# Patient Record
Sex: Male | Born: 1985 | Race: Black or African American | Hispanic: No | Marital: Single | Smoking: Current every day smoker
Health system: Southern US, Community
[De-identification: ages and names within clinical notes are randomized; demographics above are authoritative.]

## PROBLEM LIST (undated history)

## (undated) DIAGNOSIS — I10 Essential (primary) hypertension: Secondary | ICD-10-CM

---
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
## 2007-06-02 ENCOUNTER — Ambulatory Visit: Payer: Self-pay | Admitting: Internal Medicine

## 2007-06-02 ENCOUNTER — Inpatient Hospital Stay (HOSPITAL_COMMUNITY): Admission: EM | Admit: 2007-06-02 | Discharge: 2007-06-03 | Payer: Self-pay | Admitting: Emergency Medicine

## 2008-04-04 ENCOUNTER — Inpatient Hospital Stay (HOSPITAL_COMMUNITY): Admission: EM | Admit: 2008-04-04 | Discharge: 2008-04-07 | Payer: Self-pay | Admitting: Emergency Medicine

## 2008-04-24 ENCOUNTER — Emergency Department (HOSPITAL_COMMUNITY): Admission: EM | Admit: 2008-04-24 | Discharge: 2008-04-24 | Payer: Self-pay | Admitting: Emergency Medicine

## 2008-05-22 ENCOUNTER — Encounter: Admission: RE | Admit: 2008-05-22 | Discharge: 2008-05-22 | Payer: Self-pay | Admitting: Otolaryngology

## 2008-11-11 ENCOUNTER — Emergency Department (HOSPITAL_COMMUNITY): Admission: EM | Admit: 2008-11-11 | Discharge: 2008-11-11 | Payer: Self-pay | Admitting: Emergency Medicine

## 2009-05-13 ENCOUNTER — Emergency Department (HOSPITAL_COMMUNITY): Admission: EM | Admit: 2009-05-13 | Discharge: 2009-05-14 | Payer: Self-pay | Admitting: Emergency Medicine

## 2010-08-18 ENCOUNTER — Encounter: Payer: Self-pay | Admitting: Otolaryngology

## 2010-10-31 LAB — URINALYSIS, ROUTINE W REFLEX MICROSCOPIC
Bilirubin Urine: NEGATIVE
Hgb urine dipstick: NEGATIVE
Ketones, ur: NEGATIVE mg/dL
Nitrite: NEGATIVE
Specific Gravity, Urine: 1.01 (ref 1.005–1.030)
Urobilinogen, UA: 0.2 mg/dL (ref 0.0–1.0)
pH: 6 (ref 5.0–8.0)

## 2010-10-31 LAB — BASIC METABOLIC PANEL
GFR calc Af Amer: >60 mL/min (ref >60)
Glucose, Bld: 370 mg/dL — ABNORMAL HIGH (ref 70–99)
Potassium: 3.3 mEq/L — ABNORMAL LOW (ref 3.5–5.1)
Sodium: 134 mEq/L — ABNORMAL LOW (ref 135–145)

## 2010-10-31 LAB — DIFFERENTIAL
Basophils Relative: 1 % (ref 0–1)
Eosinophils Absolute: 0.2 10*3/uL (ref 0.0–0.7)
Eosinophils Relative: 2 % (ref 0–5)
Lymphs Abs: 2.9 10*3/uL (ref 0.7–4.0)
Monocytes Absolute: 0.6 10*3/uL (ref 0.1–1.0)
Neutro Abs: 6.8 10*3/uL (ref 1.7–7.7)
Neutrophils Relative %: 64 % (ref 43–77)

## 2010-10-31 LAB — GLUCOSE, CAPILLARY
Glucose-Capillary: 253 mg/dL — ABNORMAL HIGH (ref 70–99)
Glucose-Capillary: 364 mg/dL — ABNORMAL HIGH (ref 70–99)

## 2010-10-31 LAB — CBC: Platelets: 467 10*3/uL — ABNORMAL HIGH (ref 150–400)

## 2010-10-31 LAB — URINE MICROSCOPIC-ADD ON: Urine-Other: NONE SEEN

## 2010-12-10 NOTE — H&P (Signed)
NAME:  Curtis Rich, Curtis Rich                   ACCOUNT NO.:  [id number]   MEDICAL RECORD NO.:  [id number]          Curtis Rich TYPE:  INP   LOCATION:  A330                          FACILITY:  [facility]   PHYSICIAN:  Osvaldo Shipper, MD     DATE OF BIRTH:  [birth year]   DATE OF ADMISSION:  06/02/2007  DATE OF DISCHARGE:  LH                              HISTORY & PHYSICAL   PRIMARY CARE PHYSICIAN:  Dr. Daphine Deutscher, [facility],  [place], [place]. C.   ADMITTING DIAGNOSIS:  1. Acute pancreatitis of unclear etiology.  2. History of hypertension.  3. Obesity.   CHIEF COMPLAINT:  Abdominal pain for two months.   HISTORY OF PRESENT ILLNESS:  Curtis Rich is a 25 year old African-American  male, who goes to school in [place] and who has history of  hypertension, who is obese and does play football in his school, who  started having abdominal pain in his upper abdomen about two months ago.  He kind of ignored it and then, last night, it got worse and he decided  to come into the ED.  The pain is described as a cramping type of pain,  on and off, located in the upper abdomen, radiating to the left and the  back occasionally.  Pain was 10/10 in intensity when he came in.  No  precipitating factor identified.  No aggravating or relieving factors  identified.   There was no nausea, vomiting or diarrhea.  The Curtis Rich's pain now is  improved from when he was in the ED.  He has never had similar complaints before.  He does not consume alcohol  on a significant basis.  Denies any illicit drug use.   MEDICATIONS AT HOME:  He is on Benicar 20 mg once a day and has been on  it for three years now.   ALLERGIES:  He denied any drug allergies.   PAST MEDICAL HISTORY:  Hypertension, diagnosed three years ago.  He is  otherwise obese.  Denies any other medical problems.  Denies any  surgical history, as well.   SOCIAL HISTORY:  He lives actually in [place], goes to school there,  but he is currently visiting his  parents here, in [place].  He denies  smoking.  Occasional alcohol use.  Denies illicit drug use.  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   FAMILY HISTORY:  Positive hypertension, diabetes, otherwise  unremarkable.   REVIEW OF SYSTEMS:  GENERALLY:  Positive for weakness.  HEENT:  Unremarkable.  CARDIOVASCULAR:  Unremarkable.  RESPIRATORY:  Unremarkable.  GI:  As in HPI.  GU:  Unremarkable.  MUSCULOSKELETAL:  Unremarkable.  DERMATOLOGICAL:  Unremarkable.  PSYCHIATRIC:  Unremarkable.  NEUROLOGIC:  Unremarkable.  OTHER SYSTEMS:  Unremarkable.   PHYSICAL EXAMINATION:  VITAL SIGNS:  Temperature 98, blood pressure  133/77, heart rate 67, respiratory rate 18, saturation not tested.  GENERAL EXAM:  This is an obese, young, black male, in no distress.  HEENT:  There is no pallor, no icterus.  Oral mucous membranes moist, no  oral lesions are noted.  NECK:  Soft and supple.  No thyromegaly is appreciated.  LUNGS:  Clear to auscultation bilaterally.  CARDIOVASCULAR:  S1, S2 normal, regular.  No murmurs appreciated.  No S3  or S4.  ABDOMEN:  Obese.  There is some mild tenderness in the epigastric area.  No rebound, rigidity or guarding.  No mass or organomegaly is  appreciated.  EXTREMITIES:  Without edema.  Peripheral pulses are palpable.  NEUROLOGICALLY:  Curtis Rich is alert and oriented times three.  No focal  neurological deficits are present.   LABORATORY DATA:  White count is normal, hemoglobin 12.8, MCV is 76,  platelet count is 522.  Glucose 115, AST is 46, amylase 247, lipase 315.  UA showed small leukocytes, 21-50 WBC, few bacteria.   He had a CT of his abdomen which showed evidence for mild acute  pancreatitis and mild upper abdominal adenopathy, thought to be  reactive.   IMPRESSION:  This is a 25 year old African-American male with  hypertension, who presents with two-month history of abdominal pain.  He  has evidence for acute pancreatitis.  Etiology is unclear.  No   gallstones detected on CT.  We will get an ultrasound.  He denies  significant alcohol use.  He could have dyslipidemia and  hypertriglyceridemia, which could be causing this.  He could have occult  gallstones, as well.   PLAN:  1. Acute pancreatitis.  Check triglyceride levels.  Check ultrasound.      GI is following him.  They will advance his diet when they think it      is feasible.  We will await improvement symptomatically and workup      to be completed before discharging this Curtis Rich.  2. Possible urinary tract infection, will be given Cipro at least for      three days.  3. Hypertension.  Continue Benicar for now.   Further management decisions will be based on results of initial testing  and Curtis Rich's response to treatment.      Osvaldo Shipper, MD  Electronically Signed     GK/MEDQ  D:  06/02/2007  T:  06/02/2007  Job:  [id number]   cc:   Ginette Otto, [place] [facility]

## 2010-12-10 NOTE — Discharge Summary (Signed)
Curtis Rich, Curtis Rich                   ACCOUNT NO.:  [id number]   MEDICAL RECORD NO.:  [id number]          PATIENT TYPE:  INP   LOCATION:  A325                          FACILITY:  [facility]   PHYSICIAN:  Osvaldo Shipper, MD     DATE OF BIRTH:  1986/01/15   DATE OF ADMISSION:  04/04/2008  DATE OF DISCHARGE:  09/11/2009LH                               DISCHARGE SUMMARY   PRIMARY CARE PHYSICIAN:  Dr. Daphine Deutscher in [place].   DISCHARGE DIAGNOSES:  1. Tonsillitis/pharyngitis, improved.  2. Allergic rhinitis.  3. Type 2 diabetes.  4. Hypertension.  5. Obesity.   Please review H&P dictated by Dr. Lilian Kapur for details regarding the  patient's presenting illness.   BRIEF HOSPITAL COURSE:  Briefly, this is a 25 year old African American  male who presented to the ED complaining of sore throat.  He had been to  the [facility] about 10 days prior to his presentation with  similar complaints and was prescribed amoxicillin.  He took about 8 days  of amoxicillin and showed no improvement, and so he presented to the ED.  He was found to be afebrile in the emergency department.  He did,  however, have an elevated white count of 34,000.  He was also noted to  be slightly dehydrated.  His strep screen was negative.  Influenza was  negative.  Blood cultures negative.  He underwent CT scan of his neck  with contrast which showed generalized tonsillar enlargement in the  nasopharynx and the oropharynx.  Mild bilateral jugulodigastric  lymphadenopathy was also noted.  No evidence for abscess was seen.   With the Unasyn, the patient has improved.  His white count has improved  to 23,000.  He remains afebrile.  He is tolerating p.o. intake.  No  difficulty with breathing has been noticed.  No stridor was seen on  examination.  The rest of the treatment can be continued as an  outpatient.  Instead of amoxicillin, I am prescribing Keflex, which  might work better for him.   I have also told the patient  to talk to his doctor about referring him  to an ENT specialist.  This is the patient's third episode of  tonsillitis within the last year and a half.  The patient may be a  candidate for an elective tonsillectomy in the near future.   He also has a history of type 2 diabetes for which he was started back  on his oral hypoglycemic agents.  HbA1c was not checked during this  admission.   He was also noted to be hypertensive.  Upon further questioning, the  patient mentioned that he used to take Benicar, but he stopped taking it  per his PMDs recommendations.  His blood pressure has been consistently  running high even without significant pain, and so we restarted the  Benicar.  His blood pressures have improved.  He will need to follow up  with his PMD.  If he has not been worked up for secondary causes of  hypertension, we would recommend that this also be pursued as  an  outpatient.   He also has obesity.  Weight loss counseling was provided.   I have told him to stay off work for at least 1 week, and then talk to  his PMD about returning to work.   On the day of discharge, the patient is feeling better.  He is having  some nasal congestion, which he was having yesterday as well, for which  I have started Flonase and that seems to be helping slightly.  Otherwise, his throat feels better.  He is tolerating p.o. intake quite  well.  Denies any shortness of breath.  No drooling of saliva is noted.  His vital signs, except for the blood pressure, everything is quite  stable.  His throat did show erythema in the pharyngeal area with  enlarged tonsils.  No stridor was noted.  He does have cervical  lymphadenopathy bilaterally.  Examination of the nose continued to  reveal nasal congestion.   Lungs were clear.  Cardiac exam was benign.   LABORATORY DATA:  White count is 23,700 today with 80% neutrophils, no  bands reported.  Hemoglobin is 12.6, platelet count is 510.  His sodium  is  134.  His other electrolytes are normal.   So the patient is stable for discharge.  I have told him to return to  the hospital if his symptoms get worse, if he develops high fevers, has  difficulty breathing or difficulty swallowing food and water.   DISCHARGE MEDICATIONS:  1. Keflex 500 mg t.i.d. for 10 days.  2. Benicar 20 mg once daily.  3. ActoPlus Met 15/850 once daily.  4. Flonase 1 spray each nostril once a day.   DIET:  Soft diet with chopped meats for the next 1 week, and then as he  feels better he can increase and go back to his baseline diet.   PHYSICAL ACTIVITY:  No exertion for the next 1 week.  To stay out of  work for the next 1 week.  A note will be provided to the patient.   Follow up with his PMD early next week neck.   ENT referral to be provided by his PMD.   Total time on this discharge encounter 35 minutes.      Osvaldo Shipper, MD  Electronically Signed    GK/MEDQ  D:  04/07/2008  T:  04/07/2008  Job:  [id number]   cc:   Daphine Deutscher, MD  [employer]  [place], [place]

## 2010-12-10 NOTE — Consult Note (Signed)
NAME:  Curtis Rich, BORN                   ACCOUNT NO.:  [id number]   MEDICAL RECORD NO.:  [id number]          PATIENT TYPE:  INP   LOCATION:  A330                          FACILITY:  [facility]   PHYSICIAN:  R. Roetta Sessions, M.D. DATE OF BIRTH:  [birth year]   DATE OF CONSULTATION:  06/02/2007  DATE OF DISCHARGE:                                 CONSULTATION   REASON FOR CONSULTATION:  Acute pancreatitis.   REQUESTING PHYSICIAN:  [facility].   HISTORY OF PRESENT ILLNESS:  The patient is a 25 year old African-  American gentleman who presented to the emergency department with  progressive abdominal pain. He complains of intermittent abdominal pain  for about four weeks. Pain is located in the epigastric and left upper  quadrant region. Pain is not necessarily worse with meals. He has noted  a diminished appetite. He states he does not eat like he used to. He  plays football and is a fairly large guy. He has been able to proceed  with football practice up until this week. He also has had some  constipation. He denies any melena or rectal bleeding, heartburn,  dysphagia, odynophagia, nausea or vomiting. He states he does consume  some alcohol but is very limited in amounts. He never drinks more than 1  beer at a time. He has not had any alcohol in over a week. He states he  had his lipid panel done about a year ago and was unremarkable. He does  have hypertension.   MEDICATIONS AT HOME:  Benicar 20 mg daily. Denies any NSAID or aspirin  use.   ALLERGIES:  No known drug allergies.   PAST MEDICAL HISTORY:  Hypertension.   PAST SURGICAL HISTORY:  None.   FAMILY HISTORY:  Negative for chronic GI illness, colorectal cancer or  pancreatitis.   SOCIAL HISTORY:  He is single. 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 Occasional alcohol use as outlined above.  Denies tobacco or drug use.   REVIEW OF SYSTEMS:  See HPI for GI, constitutional. CARDIOPULMONARY:  Denies  chest pain or shortness of breath.   PHYSICAL EXAMINATION:  Temperature 98, pulse 67, respirations 20, blood  pressure 133/77.  GENERAL:  Pleasant, obese, black male in no acute distress. He is  accompanied by his parents.  SKIN:  Warm and dry. No jaundice.  HEENT:  Sclerae nonicteric. Oropharyngeal mucosa moist and pink. No  lesions, erythema or exudate. No lymphadenopathy or thyromegaly.  CHEST:  Lungs are clear to auscultation.  CARDIAC EXAM:  Reveals regular rate and rhythm. No murmurs, rubs, or  gallops.  ABDOMEN:  Positive bowel sounds, obese but symmetrical, soft, nontender.  No organomegaly or masses. No rebound tenderness or guarding. No  abdominal hernias.  EXTREMITIES:  No edema.   LABORATORY DATA:  Sodium 138, potassium 4, BUN 13, creatinine 1.05,  glucose 115, total bilirubin 0.8, alkaline phosphatase 87, AST elevated  at 46, ALT 43, calcium 9.4.  Amylase 247, lipase 315. White count 8900,  hemoglobin 12.8, MCV 76.4, platelets 522,000. Urinalysis revealed trace  blood, small leukocytes. Microscopic revealed 21 to 50 WBCs, 3 to 6 RBCs  and few bacteria. CT of the abdomen and pelvis pending.   IMPRESSION:  The patient is a 25 year old gentleman with acute  pancreatitis. Etiology unclear at this time. He denies any significant  alcohol use. We need to exclude the possibility of biliary pancreatitis.  Triglyceride levels are pending although he reports they were normal  previously. He also has an abnormal urinalysis.   RECOMMENDATIONS:  1. N.p.o. and continue supportive measures.  2. PPI.  3. Follow up CT and labs as available.  4. Evaluation and workup of abnormal urinalysis per hospitalist.      Tana Coast, P.A.      Jonathon Bellows, M.D.  Electronically Signed    LL/MEDQ  D:  06/02/2007  T:  06/02/2007  Job:  [id number]   cc:   [facility]

## 2010-12-10 NOTE — Discharge Summary (Signed)
Curtis Rich, Curtis Rich                   ACCOUNT NO.:  [id number]   MEDICAL RECORD NO.:  [id number]          PATIENT TYPE:  INP   LOCATION:  A330                          FACILITY:  [facility]   PHYSICIAN:  Osvaldo Shipper, MD     DATE OF BIRTH:  Sep 30, 1985   DATE OF ADMISSION:  06/02/2007  DATE OF DISCHARGE:  11/06/2008LH                               DISCHARGE SUMMARY   DISCHARGE DIAGNOSES:  1. Acute pancreatitis, unclear etiology, improved.  2. Obesity.  3. Hypertension.   Please see H&P for details regarding present illness.   BRIEF HOSPITAL COURSE:  This a 25 year old African American male who is  obese who has a history of hypertension who presented complaining of two-  month history of abdominal pain.  The patient was found to have acute  pancreatitis.  He underwent a CT scan of the abdomen which showed  findings of pancreatitis.  He underwent ultrasound as well which did not  show any biliary stones or ductal dilatation.  The patient was seen by  gastroenterology who advanced his diet yesterday and he has done well.  The pain has improved to about 3 out of 10 and he does not have any  nausea or vomiting and is tolerating p.o. intake quite well.  He is keen  on going home and I see no reason why he cannot be discharged at this  time.  GI does not want to additional work-up. His triglycerides were  checked and were normal.  Really no other etiology for his pain has been  found.  He has been told to absolutely abstain from any alcohol  consumption.  If he has recurrence of his symptoms, he may need  additional work-up in the form of an EUS.  The patient also had mildly  positive urinalysis for UTI.  Hence, we gave him a short course of  Cipro.   On the day of discharge, the patient is feeling better.  Pain is  improved. No nausea or vomiting.  Vital signs are all stable.  Examination is unremarkable and he is considered stable for discharge.   DISCHARGE MEDICATIONS:  1. Continue  Benicar 20 mg daily.  2. Cipro 250 mg b.i.d. for three more days.  3. Oxycodone 5 mg every six hours as needed for pain.   DIET:  He can have a low fat diet.   ACTIVITY:  He has been told to refrain from strenuous activity including  sports for about two weeks.  The patient does play football at his  school.  I have given him a note stating the above.   FOLLOW UP:  Follow up with GI in four to six weeks.  The patient does go  to school near [place], hence they can reschedule as they deem  necessary.  He will need followup with his PMD, Dr. Alwyn Pea with  [facility] in [place].   CONSULTATIONS:  Gastroenterology.   STUDIES:  CT scan of the abdomen.  Ultrasound of the abdomen.  Both  discussed above.   Total time of discharge:  40 minutes.  Osvaldo Shipper, MD  Electronically Signed     GK/MEDQ  D:  06/03/2007  T:  06/03/2007  Job:  [id number]   cc:   R. Roetta Sessions, M.D.  [address]  [place]  [place] [address]   Cephas Darby. Daphine Deutscher, M.D.  Fax: [phone]

## 2010-12-10 NOTE — H&P (Signed)
Curtis Rich, Curtis Rich                   ACCOUNT NO.:  [id number]   MEDICAL RECORD NO.:  [id number]          PATIENT TYPE:  INP   LOCATION:  A325                          FACILITY:  [facility]   PHYSICIAN:  Skeet Latch, DO    DATE OF BIRTH:  1986-03-27   DATE OF ADMISSION:  04/04/2008  DATE OF DISCHARGE:  LH                              HISTORY & PHYSICAL   PRIMARY CARE PHYSICIAN:  Tanya D. Daphine Deutscher, M.D. at [facility].   CHIEF COMPLAINT:  Sore throat.   HISTORY OF PRESENT ILLNESS:  This is a 25 year old African American male  who presented with a complaint of sore throat.  The patient states that  over the past three weeks, he has been having a continued sore throat.  He went to an urgent care and was treated for 10 days of antibiotics.  The patient states that the symptoms returned after the antibiotics were  discontinued, and the sore throat became more swollen, sore, painful.  Admitted to some fevers, difficulty swallowing, and slight chills.   PAST MEDICAL HISTORY:  1. Hypertension.  2. Diabetes.   PAST SURGICAL HISTORY:  Unremarkable.   FAMILY HISTORY:  Positive for diabetes.   SOCIAL HISTORY:  No history of alcohol or drug abuse.  Denies any  tobacco abuse.   DRUG ALLERGIES:  No known drug allergies.   HOME MEDICATIONS:  Patient says he takes medications for his diabetes,  unsure of the name and dosage.  Patient was on amoxicillin 1600 mg twice  daily.   REVIEW OF SYSTEMS:  CONSTITUTIONAL:  Positive for fever, chills, and  some anorexia.  HEENT:  Unremarkable except for sore throat and some nasal congestion.  CARDIOVASCULAR:  No chest pain or palpitations.  RESPIRATORY:  A slight cough.  No dyspnea or wheezing.  GASTROINTESTINAL:  No nausea, vomiting, diarrhea, or abdominal pain.  No  blood in the stool.  GENITOURINARY:  Unremarkable.  MUSCULOSKELETAL:  Positive for some neck pain.  Other systems are unremarkable.   PHYSICAL EXAMINATION:  Temperature  98.1, pulse 102, respirations 16,  blood pressure 145/76.  He is satting at 96% on room air.  CONSTITUTIONAL:  He is well-developed, well-nourished, and well-  hydrated.  No acute distress.  HEENT:  Head is normocephalic and atraumatic.  His eyes are PERRL, EOMI.  NECK:  Soft.  Slight cervical adenopathy is appreciated.  There is some  slight erythema to his pharynx.  Tonsils are swollen.  He had no  meningeal signs.  Slight submandibular swelling with __________ was  noted also.  CARDIOVASCULAR:  No murmurs, gallops or rubs.  He is slightly  tachycardic.  RESPIRATORY:  Lungs are clear to auscultation bilaterally.  No wheezes,  rales or rhonchi.  ABDOMEN:  Obese, soft, nontender, nondistended.  No rigidity or  guarding.  EXTREMITIES:  No clubbing, cyanosis or edema.  NEUROLOGIC:  Cranial nerves II-XII are grossly intact.  Patient is alert  and oriented x3.  SKIN:  Warm and dry.  No rashes are appreciated.   LABS:  So far, the blood cultures are unremarkable.  Rapid strep screen  is negative.  Monospot screen is negative.  Sodium 133, potassium 3.8,  chloride 100, CO2 26, glucose 170, BUN 9, creatinine 0.94.  His white  count is 34,800, hemoglobin 14, hematocrit 41.8, platelet count 475,000.   RADIOLOGIC STUDIES:  CT of his neck with contrast showed a generalized  tonsillar enlargement in the nasopharynx and oropharynx.  Mild bilateral  jugulodigastric lymphadenopathy with other shotty bilateral cervical  lymph nodes, lightly reactive in nature.  No evidence of abscess.   ASSESSMENT:  1. Leukocytosis.  2. Questionable tonsillitis with lymphadenopathy and cervical lymph      nodes.  3. History of diabetes.  4. Hypertension.   PLAN:  1. Patient admitted to a general medical bed.  2. Patient has been placed on IV Unasyn.  This will be continued at      this time.  Patient's white cell count will be checked in the a.m.      We will get a rapid flu test on the patient at this time  to rule      out any influenza also.  Patient will also be IV hydrated at this      time.  3. For his diabetes, patient will be placed on a sliding scale.  Blood      sugars checked q.a.c. and nightly.  Will get his home medications,      to start patient back on his home medications at this time.  4. Hypertension:  A little higher than normal.  Will continue to      monitor.  Patient states that he was on blood pressure medications      in the past and was discontinued.  5. Will follow patient's swallowing.  Will start the patient on a      liquid diet at this time and advance as tolerated.      Skeet Latch, DO  Electronically Signed     SM/MEDQ  D:  04/04/2008  T:  04/04/2008  Job:  [id number]

## 2011-03-25 ENCOUNTER — Emergency Department (HOSPITAL_COMMUNITY)
Admission: EM | Admit: 2011-03-25 | Discharge: 2011-03-25 | Disposition: A | Discharge status: Home / Self Care | Payer: BC Managed Care – PPO | Attending: Emergency Medicine | Admitting: Emergency Medicine

## 2011-03-25 DIAGNOSIS — Z794 Long term (current) use of insulin: Secondary | ICD-10-CM | POA: Insufficient documentation

## 2011-03-25 DIAGNOSIS — R739 Hyperglycemia, unspecified: Secondary | ICD-10-CM

## 2011-03-25 DIAGNOSIS — E119 Type 2 diabetes mellitus without complications: Secondary | ICD-10-CM | POA: Insufficient documentation

## 2011-03-25 DIAGNOSIS — F17200 Nicotine dependence, unspecified, uncomplicated: Secondary | ICD-10-CM | POA: Insufficient documentation

## 2011-03-25 LAB — DIFFERENTIAL
Eosinophils Absolute: 0.4 10*3/uL (ref 0.0–0.7)
Eosinophils Relative: 3 % (ref 0–5)
Lymphocytes Relative: 37 % (ref 12–46)
Lymphs Abs: 4.6 10*3/uL — ABNORMAL HIGH (ref 0.7–4.0)
Monocytes Absolute: 1 10*3/uL (ref 0.1–1.0)
Monocytes Relative: 8 % (ref 3–12)
Neutro Abs: 6.4 10*3/uL (ref 1.7–7.7)
Neutrophils Relative %: 52 % (ref 43–77)

## 2011-03-25 LAB — BASIC METABOLIC PANEL
BUN: 8 mg/dL (ref 6–23)
Calcium: 9.8 mg/dL (ref 8.4–10.5)
Creatinine, Ser: 0.91 mg/dL (ref 0.50–1.35)
Glucose, Bld: 460 mg/dL — ABNORMAL HIGH (ref 70–99)

## 2011-03-25 LAB — CBC
Hemoglobin: 14.4 g/dL (ref 13.0–17.0)
Platelets: 414 10*3/uL — ABNORMAL HIGH (ref 150–400)
RBC: 5.59 MIL/uL (ref 4.22–5.81)
WBC: 12.4 10*3/uL — ABNORMAL HIGH (ref 4.0–10.5)

## 2011-03-25 LAB — GLUCOSE, CAPILLARY: Glucose-Capillary: 545 mg/dL — ABNORMAL HIGH (ref 70–99)

## 2011-03-25 MED ORDER — SODIUM CHLORIDE 0.9 % IV BOLUS (SEPSIS)
1000.0000 mL | Freq: Once | INTRAVENOUS | Status: AC
  Administered 2011-03-25: 1000 mL via INTRAVENOUS

## 2011-03-25 MED ORDER — INSULIN ASPART 100 UNIT/ML ~~LOC~~ SOLN
10.0000 [IU] | Freq: Once | SUBCUTANEOUS | Status: AC
  Administered 2011-03-25: 10 [IU] via SUBCUTANEOUS

## 2011-03-25 MED ORDER — INSULIN REGULAR HUMAN 100 UNIT/ML IJ SOLN: 10.0000 [IU] | Freq: Once | INTRAMUSCULAR | Status: DC

## 2011-03-25 MED ORDER — INSULIN ASPART PROT & ASPART (70-30 MIX) 100 UNIT/ML ~~LOC~~ SUSP
20.0000 [IU] | SUBCUTANEOUS | Status: DC
  Administered 2011-03-25: 20 [IU] via SUBCUTANEOUS
  Filled 2011-03-25: qty 10

## 2011-03-25 MED ORDER — INSULIN ASPART PROT & ASPART (70-30 MIX) 100 UNIT/ML ~~LOC~~ SUSP: 40.0000 [IU] | Freq: Two times a day (BID) | SUBCUTANEOUS | Status: DC

## 2011-03-25 NOTE — ED Notes (Signed)
Left in c/o wife for transport home; a&ox4; in no distress

## 2011-03-25 NOTE — ED Notes (Signed)
Pt req not to be placed in hallway  bed

## 2011-03-25 NOTE — ED Notes (Signed)
Blood sugar elevated for a couple of weeks

## 2011-03-25 NOTE — ED Provider Notes (Signed)
History   Chart scribed for Donnetta Hutching, MD by [facility]; the patient was seen in room APA14/APA14; this patient's care was started at 2:31 PM.    CSN: [id number] Arrival date & time: 03/25/2011  2:07 PM  Chief Complaint  Patient presents with  . Hyperglycemia   HPI Curtis Rich is a 25 y.o. male who presents to the Emergency Department complaining of elevated blood sugar. Pt with DM, dx 3 years ago and blood sugar well-controlled (runs 110-120) with novalog 70/30 (40 units BID). Pt reports he ran out of his meds over the weekend and is unable to fill new rx until he is paid. Today when pt checked blood sugar, >600. He otherwise feels well. Denies n/v, abd pain, ha, dizziness, cp, or sob. No urinary complaints.   Past Medical History  Diagnosis Date  . Diabetes mellitus     History reviewed. No pertinent past surgical history.  History reviewed. No pertinent family history.  History  Substance Use Topics  . Smoking status: Current Everyday Smoker  . Smokeless tobacco: Not on file  . Alcohol Use: No   Previous Medications   No medications on file   *Novalog  Allergies as of 03/25/2011  . (No Known Allergies)      Review of Systems 10 Systems reviewed and are negative for acute change except as noted in the HPI.  Physical Exam  BP 155/98  Pulse 81  Temp(Src) 98.7 F (37.1 C) (Oral)  Ht 6\' 3"  (1.905 m)  Wt 325 lb (147.419 kg)  BMI 40.62 kg/m2  SpO2 98%  Physical Exam  Nursing note and vitals reviewed. Constitutional: He is oriented to person, place, and time. No distress.       Appearance consistent with age of record  HENT:  Head: Normocephalic and atraumatic.  Right Ear: External ear normal.  Left Ear: External ear normal.  Nose: Nose normal.  Mouth/Throat: Oropharynx is clear and moist.  Eyes: Conjunctivae are normal.  Neck: Neck supple.  Cardiovascular: Normal rate and regular rhythm.  Exam reveals no gallop and no friction rub.   No murmur  heard. Pulmonary/Chest: Effort normal and breath sounds normal. He has no wheezes. He has no rhonchi. He has no rales. He exhibits no tenderness.  Abdominal: Soft. There is no tenderness.  Musculoskeletal: Normal range of motion.       Normal appearance of extremities  Neurological: He is alert and oriented to person, place, and time. No sensory deficit.  Skin: No rash noted.       Color normal  Psychiatric: He has a normal mood and affect.    ED Course  Procedures OTHER DATA REVIEWED: Nursing notes and vital signs reviewed. Prior records reviewed.   LABS / RADIOLOGY: Results for orders placed during the hospital encounter of 03/25/11  GLUCOSE, CAPILLARY      Component Value Range   Glucose-Capillary 545 (*) 70 - 99 (mg/dL)   Comment 1 Notify RN    GLUCOSE, CAPILLARY      Component Value Range   Glucose-Capillary 496 (*) 70 - 99 (mg/dL)     ED COURSE: All results reviewed and discussed with pt, questions answered, pt agreeable with plan. Note patient in DKA. Will hydrate, labs, IV insulin.  MDM: Feeling much better after hydration and IV insulin. Not in DKA. Discussed with patient and girlfriend seriousness of not taking insulin. He understands  IMPRESSION: 1. Diabetes mellitus   2. Hyperglycemia       CONDITION ON DISCHARGE:  Improved  MEDS GIVEN IN ED:  Medications  sodium chloride 0.9 % bolus 1,000 mL (1000 mL Intravenous Given 03/25/11 1555)  insulin aspart protamine-insulin aspart (NOVOLOG 70/30) injection 20 Units (not administered)  insulin aspart protamine-insulin aspart (NOVOLOG MIX 70/30) (70-30) 100 UNIT/ML injection (not administered)  sodium chloride 0.9 % bolus 1,000 mL (1000 mL Intravenous Given 03/25/11 1437)  insulin aspart (novoLOG) injection 10 Units (10 Units Subcutaneous Given 03/25/11 1510)     DISCHARGE MEDICATIONS: New Prescriptions   INSULIN ASPART PROTAMINE-INSULIN ASPART (NOVOLOG MIX 70/30) (70-30) 100 UNIT/ML INJECTION    Inject 40  Units into the skin 2 (two) times daily with a meal.     SCRIBE ATTESTATION: I personally performed the services described in this documentation, which was scribed in my presence. The recorded information has been reviewed and considered. Donnetta Hutching, MD       Donnetta Hutching, MD 03/25/11 [phone]

## 2011-04-28 LAB — BASIC METABOLIC PANEL
BUN: 9
Chloride: 100
Creatinine, Ser: 0.85
GFR calc Af Amer: >60
GFR calc non Af Amer: >60
Potassium: 3.4 — ABNORMAL LOW

## 2011-04-28 LAB — DIFFERENTIAL
Basophils Absolute: 0
Eosinophils Relative: 1
Lymphocytes Relative: 9 — ABNORMAL LOW
Lymphs Abs: 1.5
Neutro Abs: 14.8 — ABNORMAL HIGH

## 2011-04-28 LAB — CBC
HCT: 40.1
Hemoglobin: 13.4
Platelets: 519 — ABNORMAL HIGH
WBC: 18.3 — ABNORMAL HIGH

## 2011-04-28 LAB — RAPID STREP SCREEN (MED CTR MEBANE ONLY): Streptococcus, Group A Screen (Direct): NEGATIVE

## 2011-04-30 LAB — GLUCOSE, CAPILLARY
Glucose-Capillary: 115 — ABNORMAL HIGH
Glucose-Capillary: 151 — ABNORMAL HIGH
Glucose-Capillary: 182 — ABNORMAL HIGH
Glucose-Capillary: 217 — ABNORMAL HIGH
Glucose-Capillary: 315 — ABNORMAL HIGH
Glucose-Capillary: 440 — ABNORMAL HIGH

## 2011-04-30 LAB — APTT: aPTT: 31

## 2011-04-30 LAB — DIFFERENTIAL
Basophils Absolute: 0
Basophils Absolute: 0
Basophils Relative: 0
Basophils Relative: 0
Basophils Relative: 0
Eosinophils Absolute: 0
Eosinophils Absolute: 0
Eosinophils Absolute: 0
Eosinophils Relative: 0
Eosinophils Relative: 0
Lymphocytes Relative: 4 — ABNORMAL LOW
Lymphocytes Relative: 5 — ABNORMAL LOW
Lymphs Abs: 1.4
Lymphs Abs: 1.8
Monocytes Absolute: 1.1 — ABNORMAL HIGH
Monocytes Absolute: 1.3 — ABNORMAL HIGH
Monocytes Absolute: 1.9 — ABNORMAL HIGH
Monocytes Absolute: 2.7 — ABNORMAL HIGH
Monocytes Relative: 12
Monocytes Relative: 4
Monocytes Relative: 8
Neutro Abs: 18.8 — ABNORMAL HIGH
Neutro Abs: 31.5 — ABNORMAL HIGH
Neutrophils Relative %: 82 — ABNORMAL HIGH
Neutrophils Relative %: 91 — ABNORMAL HIGH

## 2011-04-30 LAB — CULTURE, BLOOD (ROUTINE X 2): Culture: NO GROWTH

## 2011-04-30 LAB — RAPID STREP SCREEN (MED CTR MEBANE ONLY): Streptococcus, Group A Screen (Direct): NEGATIVE

## 2011-04-30 LAB — CBC
HCT: 40.3
Hemoglobin: 12.6 — ABNORMAL LOW
Hemoglobin: 13.3
Hemoglobin: 14
MCHC: 33.2
MCHC: 33.6
MCV: 75.9 — ABNORMAL LOW
MCV: 77.2 — ABNORMAL LOW
MCV: 78.4
RBC: 4.96
RBC: 5.49
RDW: 14.4
RDW: 14.4
WBC: 34.5 — ABNORMAL HIGH
WBC: 34.8 — ABNORMAL HIGH

## 2011-04-30 LAB — BASIC METABOLIC PANEL
BUN: 9
CO2: 25
CO2: 26
CO2: 27
CO2: 28
Calcium: 8.9
Calcium: 9.4
Calcium: 9.5
Calcium: 9.6
Chloride: 103
Chloride: 99
Creatinine, Ser: 0.82
Creatinine, Ser: 0.94
GFR calc Af Amer: >60
GFR calc Af Amer: >60
GFR calc non Af Amer: >60
Glucose, Bld: 117 — ABNORMAL HIGH
Glucose, Bld: 128 — ABNORMAL HIGH
Glucose, Bld: 202 — ABNORMAL HIGH
Potassium: 4.6
Sodium: 133 — ABNORMAL LOW
Sodium: 134 — ABNORMAL LOW
Sodium: 137

## 2011-04-30 LAB — INFLUENZA A+B VIRUS AG-DIRECT(RAPID)
Inflenza A Ag: NEGATIVE
Influenza B Ag: NEGATIVE

## 2011-04-30 LAB — STREP A DNA PROBE

## 2011-05-06 LAB — DIFFERENTIAL
Eosinophils Absolute: 0.3
Eosinophils Relative: 4
Lymphocytes Relative: 25
Lymphocytes Relative: 28
Lymphs Abs: 1.7
Lymphs Abs: 2.5
Monocytes Absolute: 1 — ABNORMAL HIGH
Neutrophils Relative %: 59

## 2011-05-06 LAB — CBC
MCHC: 33
MCV: 75.3 — ABNORMAL LOW
MCV: 76.4 — ABNORMAL LOW
Platelets: 478 — ABNORMAL HIGH
Platelets: 522 — ABNORMAL HIGH
WBC: 6.8
WBC: 8.9

## 2011-05-06 LAB — HEPATIC FUNCTION PANEL
ALT: 38
AST: 37
Albumin: 3.5
Alkaline Phosphatase: 82
Total Bilirubin: 1
Total Protein: 7.1

## 2011-05-06 LAB — COMPREHENSIVE METABOLIC PANEL
AST: 46 — ABNORMAL HIGH
Albumin: 3.9
Calcium: 9.4
Chloride: 105
Creatinine, Ser: 1.05
GFR calc Af Amer: >60
Sodium: 138

## 2011-05-06 LAB — URINE CULTURE
Colony Count: NO GROWTH
Culture: NO GROWTH

## 2011-05-06 LAB — LIPID PANEL
HDL: 25 — ABNORMAL LOW
Total CHOL/HDL Ratio: 6.5
Triglycerides: 80
VLDL: 16

## 2011-05-06 LAB — URINALYSIS, ROUTINE W REFLEX MICROSCOPIC
Bilirubin Urine: NEGATIVE
Glucose, UA: NEGATIVE
Specific Gravity, Urine: 1.025
Urobilinogen, UA: 0.2
pH: 6

## 2011-05-06 LAB — RAPID URINE DRUG SCREEN, HOSP PERFORMED
Barbiturates: NOT DETECTED
Opiates: NOT DETECTED
Tetrahydrocannabinol: NOT DETECTED

## 2011-05-06 LAB — AMYLASE: Amylase: 213 — ABNORMAL HIGH

## 2011-05-06 LAB — LIPASE, BLOOD: Lipase: 204 — ABNORMAL HIGH

## 2011-05-06 LAB — BASIC METABOLIC PANEL
BUN: 10
Creatinine, Ser: 0.96
GFR calc non Af Amer: >60

## 2011-05-06 LAB — URINE MICROSCOPIC-ADD ON

## 2011-11-19 ENCOUNTER — Emergency Department (HOSPITAL_COMMUNITY)
Admission: EM | Admit: 2011-11-19 | Discharge: 2011-11-20 | Disposition: A | Discharge status: Home / Self Care | Payer: BC Managed Care – PPO | Attending: Emergency Medicine | Admitting: Emergency Medicine

## 2011-11-19 ENCOUNTER — Encounter (HOSPITAL_COMMUNITY): Payer: Self-pay | Admitting: Emergency Medicine

## 2011-11-19 DIAGNOSIS — E1169 Type 2 diabetes mellitus with other specified complication: Secondary | ICD-10-CM | POA: Insufficient documentation

## 2011-11-19 DIAGNOSIS — M542 Cervicalgia: Secondary | ICD-10-CM | POA: Insufficient documentation

## 2011-11-19 DIAGNOSIS — R739 Hyperglycemia, unspecified: Secondary | ICD-10-CM

## 2011-11-19 DIAGNOSIS — J039 Acute tonsillitis, unspecified: Secondary | ICD-10-CM | POA: Insufficient documentation

## 2011-11-19 MED ORDER — AMOXICILLIN 250 MG PO CAPS
500.0000 mg | ORAL_CAPSULE | Freq: Once | ORAL | Status: AC
  Administered 2011-11-19: 500 mg via ORAL
  Filled 2011-11-19: qty 2

## 2011-11-19 MED ORDER — SODIUM CHLORIDE 0.9 % IV BOLUS (SEPSIS)
1000.0000 mL | Freq: Once | INTRAVENOUS | Status: AC
  Administered 2011-11-19: 1000 mL via INTRAVENOUS

## 2011-11-19 MED ORDER — INSULIN REGULAR HUMAN 100 UNIT/ML IJ SOLN
10.0000 [IU] | Freq: Once | INTRAMUSCULAR | Status: AC
  Administered 2011-11-19: 10 [IU] via SUBCUTANEOUS
  Filled 2011-11-19: qty 0.1

## 2011-11-19 NOTE — ED Notes (Signed)
CBG recheck per verbal order from Burgess Amor, result: 329

## 2011-11-19 NOTE — ED Notes (Signed)
Pt with Sore throat x 2 days

## 2011-11-20 LAB — BASIC METABOLIC PANEL
BUN: 7 mg/dL (ref 6–23)
Chloride: 97 mEq/L (ref 96–112)
GFR calc non Af Amer: >90 mL/min (ref >90)
Glucose, Bld: 316 mg/dL — ABNORMAL HIGH (ref 70–99)
Potassium: 3.9 mEq/L (ref 3.5–5.1)
Sodium: 133 mEq/L — ABNORMAL LOW (ref 135–145)

## 2011-11-20 MED ORDER — ONDANSETRON HCL 4 MG/2ML IJ SOLN
4.0000 mg | Freq: Once | INTRAMUSCULAR | Status: AC
  Administered 2011-11-20: 4 mg via INTRAVENOUS
  Filled 2011-11-20: qty 2

## 2011-11-20 MED ORDER — KETOROLAC TROMETHAMINE 30 MG/ML IJ SOLN
30.0000 mg | Freq: Once | INTRAMUSCULAR | Status: AC
  Administered 2011-11-20: 30 mg via INTRAVENOUS
  Filled 2011-11-20: qty 1

## 2011-11-20 MED ORDER — HYDROCODONE-ACETAMINOPHEN 7.5-500 MG/15ML PO SOLN: 15.0000 mL | Freq: Four times a day (QID) | ORAL | Status: AC | PRN

## 2011-11-20 MED ORDER — LIDOCAINE VISCOUS 2 % MT SOLN
20.0000 mL | Freq: Once | OROMUCOSAL | Status: AC
  Administered 2011-11-20: 20 mL via OROMUCOSAL
  Filled 2011-11-20: qty 15

## 2011-11-20 MED ORDER — HYDROCODONE-ACETAMINOPHEN 7.5-500 MG/15ML PO SOLN: 10.0000 mL | Freq: Once | ORAL | Status: DC

## 2011-11-20 MED ORDER — AMOXICILLIN 500 MG PO CAPS: 500.0000 mg | ORAL_CAPSULE | Freq: Once | ORAL | Status: AC

## 2011-11-20 MED ORDER — MORPHINE SULFATE 4 MG/ML IJ SOLN
4.0000 mg | Freq: Once | INTRAMUSCULAR | Status: AC
  Administered 2011-11-20: 4 mg via INTRAVENOUS
  Filled 2011-11-20: qty 1

## 2011-11-20 NOTE — ED Provider Notes (Signed)
Medical screening examination/treatment/procedure(s) were performed by non-physician practitioner and as supervising physician I was immediately available for consultation/collaboration.   Dione Booze, MD 11/20/11 2249

## 2011-11-20 NOTE — ED Provider Notes (Signed)
History     CSN: [id number]  Arrival date & time 11/19/11  2146   First MD Initiated Contact with Patient 11/19/11 2204      Chief Complaint  Patient presents with  . Sore Throat    (Consider location/radiation/quality/duration/timing/severity/associated sxs/prior treatment) HPI Comments: Patient presents with a sore throat and swollen tonsils with known history of frequent tonsillitis.  He states his throat has been sore for the past 3 weeks but worse for the past 2 days.  He has had low grade fever to 100.5.  He has been unable to eat secondary to pain but has been able to maintain fluids. His blood glucose levels have been elevated for months,  He last checked it  this am and was 350.  He denies cough, nausea or vomiting, Nasal congestion and shortness of breath.  He does have bilateral ear pain which is worse with swallowing.  He has been evaluate by Dr. Annalee Genta years ago who advised he have a tonsillectomy.  The history is provided by the patient.    Past Medical History  Diagnosis Date  . Diabetes mellitus     History reviewed. No pertinent past surgical history.  No family history on file.  History  Substance Use Topics  . Smoking status: Current Everyday Smoker  . Smokeless tobacco: Not on file  . Alcohol Use: Yes      Review of Systems  Constitutional: Positive for fever.  HENT: Positive for ear pain, sore throat, trouble swallowing and neck pain. Negative for congestion, rhinorrhea, drooling, neck stiffness and sinus pressure.   Eyes: Negative.   Respiratory: Negative for chest tightness and shortness of breath.   Cardiovascular: Negative for chest pain.  Gastrointestinal: Negative for nausea and abdominal pain.  Genitourinary: Negative.   Musculoskeletal: Negative for joint swelling and arthralgias.  Skin: Negative.  Negative for rash and wound.  Neurological: Negative for dizziness, weakness, light-headedness, numbness and headaches.  Hematological:  Negative.   Psychiatric/Behavioral: Negative.     Allergies  Review of patient's allergies indicates no known allergies.  Home Medications   Current Outpatient Rx  Name Route Sig Dispense Refill  . DIPHENHYDRAMINE HCL 25 MG PO CAPS Oral Take 50 mg by mouth as needed. For allergy symptoms    . INSULIN ASPART PROT & ASPART (70-30) 100 UNIT/ML [place] SUSP Subcutaneous Inject 40 Units into the skin 2 (two) times daily with a meal. 10 mL 12  . AMOXICILLIN 500 MG PO CAPS Oral Take 1 capsule (500 mg total) by mouth once. 30 capsule 0  . HYDROCODONE-ACETAMINOPHEN 7.5-500 MG/15ML PO SOLN Oral Take 15 mLs by mouth every 6 (six) hours as needed for pain. 150 mL 0    BP 174/108  Pulse 109  Temp(Src) 99.1 F (37.3 C) (Oral)  Resp 20  Ht 6\' 4"  (1.93 m)  Wt 300 lb (136.079 kg)  BMI 36.52 kg/m2  SpO2 100%  Physical Exam  Nursing note and vitals reviewed. Constitutional: He appears well-developed and well-nourished.  HENT:  Head: Normocephalic and atraumatic.  Right Ear: Tympanic membrane, external ear and ear canal normal.  Left Ear: Tympanic membrane, external ear and ear canal normal.  Nose: Nose normal.  Mouth/Throat: Uvula is midline and mucous membranes are normal. Posterior oropharyngeal erythema present. No oropharyngeal exudate, posterior oropharyngeal edema or tonsillar abscesses.       Bilateral 3+ tonsils nearly touching uvula.  No tonsillar abscess.  No exudate.  Eyes: Conjunctivae are normal.  Neck: Normal range of motion.  Cardiovascular: Normal rate, regular rhythm, normal heart sounds and intact distal pulses.   Pulmonary/Chest: Effort normal and breath sounds normal. No stridor. No respiratory distress. He has no wheezes.  Abdominal: Soft. Bowel sounds are normal. There is no tenderness.  Musculoskeletal: Normal range of motion.  Neurological: He is alert.  Skin: Skin is warm and dry.  Psychiatric: He has a normal mood and affect.    ED Course  Procedures (including  critical care time)  Labs Reviewed  GLUCOSE, CAPILLARY - Abnormal; Notable for the following:    Glucose-Capillary 329 (*)    All other components within normal limits  BASIC METABOLIC PANEL - Abnormal; Notable for the following:    Sodium 133 (*)    Glucose, Bld 316 (*)    All other components within normal limits  GLUCOSE, CAPILLARY - Abnormal; Notable for the following:    Glucose-Capillary 257 (*)    All other components within normal limits  RAPID STREP SCREEN   No results found.   1. Tonsillitis   2. Hyperglycemia     Patient given NS 1 liter in Ed along with regular insulin 10 units with decrease of glucose to 257.  Patient started on amoxil 500 mg po given.  Also given morphine 4 mg IV which did not relieve his throat pain.  Given toradol 30 IV, viscous lidocaine gargle and spit for improved throat pain relief.  MDM  Acute tonsillitis in setting of uncontrolled DM.  Patient advised close f/u with pcp  - pt to call for appt in am.  Amoxil prescribed.  lortab elixer for pain relief.  Rest,  Fluids.  Recheck for any worsened sx.  Close watch of cbg's recommended.         Candis Musa, PA 11/20/11 [phone]

## 2011-11-20 NOTE — Discharge Instructions (Signed)
Hyperglycemia Hyperglycemia occurs when the glucose (sugar) in your blood is too high. Hyperglycemia can happen for many reasons, but it most often happens to people who do not know they have diabetes or are not managing their diabetes properly.  CAUSES  Whether you have diabetes or not, there are other causes of hyperglycemia. Hyperglycemia can occur when you have diabetes, but it can also occur in other situations that you might not be as aware of, such as: Diabetes  If you have diabetes and are having problems controlling your blood glucose, hyperglycemia could occur because of some of the following reasons:   Not following your meal plan.   Not taking your diabetes medications or not taking it properly.   Exercising less or doing less activity than you normally do.   Being sick.  Pre-diabetes  This cannot be ignored. Before people develop Type 2 diabetes, they almost always have "pre-diabetes." This is when your blood glucose levels are higher than normal, but not yet high enough to be diagnosed as diabetes. Research has shown that some long-term damage to the body, especially the heart and circulatory system, may already be occurring during pre-diabetes. If you take action to manage your blood glucose when you have pre-diabetes, you may delay or prevent Type 2 diabetes from developing.  Stress  If you have diabetes, you may be "diet" controlled or on oral medications or insulin to control your diabetes. However, you may find that your blood glucose is higher than usual in the hospital whether you have diabetes or not. This is often referred to as "stress hyperglycemia." Stress can elevate your blood glucose. This happens because of hormones put out by the body during times of stress. If stress has been the cause of your high blood glucose, it can be followed regularly by your caregiver. That way he/she can make sure your hyperglycemia does not continue to get worse or progress to diabetes.    Steroids  Steroids are medications that act on the infection fighting system (immune system) to block inflammation or infection. One side effect can be a rise in blood glucose. Most people can produce enough extra insulin to allow for this rise, but for those who cannot, steroids make blood glucose levels go even higher. It is not unusual for steroid treatments to "uncover" diabetes that is developing. It is not always possible to determine if the hyperglycemia will go away after the steroids are stopped. A special blood test called an A1c is sometimes done to determine if your blood glucose was elevated before the steroids were started.  SYMPTOMS  Thirsty.   Frequent urination.   Dry mouth.   Blurred vision.   Tired or fatigue.   Weakness.   Sleepy.   Tingling in feet or leg.  DIAGNOSIS  Diagnosis is made by monitoring blood glucose in one or all of the following ways:  A1c test. This is a chemical found in your blood.   Fingerstick blood glucose monitoring.   Laboratory results.  TREATMENT  First, knowing the cause of the hyperglycemia is important before the hyperglycemia can be treated. Treatment may include, but is not be limited to:  Education.   Change or adjustment in medications.   Change or adjustment in meal plan.   Treatment for an illness, infection, etc.   More frequent blood glucose monitoring.   Change in exercise plan.   Decreasing or stopping steroids.   Lifestyle changes.  HOME CARE INSTRUCTIONS   Test your blood glucose  as directed.   Exercise regularly. Your caregiver will give you instructions about exercise. Pre-diabetes or diabetes which comes on with stress is helped by exercising.   Eat wholesome, balanced meals. Eat often and at regular, fixed times. Your caregiver or nutritionist will give you a meal plan to guide your sugar intake.   Being at an ideal weight is important. If needed, losing as little as 10 to 15 pounds may help  improve blood glucose levels.  SEEK MEDICAL CARE IF:   You have questions about medicine, activity, or diet.   You continue to have symptoms (problems such as increased thirst, urination, or weight gain).  SEEK IMMEDIATE MEDICAL CARE IF:   You are vomiting or have diarrhea.   Your breath smells fruity.   You are breathing faster or slower.   You are very sleepy or incoherent.   You have numbness, tingling, or pain in your feet or hands.   You have chest pain.   Your symptoms get worse even though you have been following your caregiver's orders.   If you have any other questions or concerns.  Document Released: 01/07/2001 Document Revised: 07/03/2011 Document Reviewed: 03/05/2009 [facility] Patient Information 2012 [place], [place].Tonsillitis Tonsils are lumps of lymphoid tissues at the back of the throat. Each tonsil has 20 crevices (crypts). Tonsils help fight nose and throat infections and keep infection from spreading to other parts of the body for the first 18 months of life. Tonsillitis is an infection of the throat that causes the tonsils to become red, tender, and swollen. CAUSES Sudden and, if treated, temporary (acute) tonsillitis is usually caused by infection with streptococcal bacteria. Long lasting (chronic) tonsillitis occurs when the crypts of the tonsils become filled with pieces of food and bacteria, which makes it easy for the tonsils to become constantly infected. SYMPTOMS  Symptoms of tonsillitis include:  A sore throat.   White patches on the tonsils.   Fever.   Tiredness.  DIAGNOSIS Tonsillitis can be diagnosed through a physical exam. Diagnosis can be confirmed with the results of lab tests, including a throat culture. TREATMENT  The goals of tonsillitis treatment include the reduction of the severity and duration of symptoms, prevention of associated conditions, and prevention of disease transmission. Tonsillitis caused by bacteria can be treated with  antibiotics. Usually, treatment with antibiotics is started before the cause of the tonsillitis is known. However, if it is determined that the cause is not bacterial, antibiotics will not treat the tonsillitis. If attacks of tonsillitis are severe and frequent, your caregiver may recommend surgery to remove the tonsils (tonsillectomy). HOME CARE INSTRUCTIONS   Rest as much as possible and get plenty of sleep.   Drink plenty of fluids. While the throat is very sore, eat soft foods or liquids, such as sherbet, soups, or instant breakfast drinks.   Eat frozen ice pops.   Older children and adults may gargle with a warm or cold liquid to help soothe the throat. Mix 1 teaspoon of salt in 1 cup of water.   Other family members who also develop a sore throat or fever should have a medical exam or throat culture.   Only take over-the-counter or prescription medicines for pain, discomfort, or fever as directed by your caregiver.   If you are given antibiotics, take them as directed. Finish them even if you start to feel better.  SEEK MEDICAL CARE IF:   Your baby is older than 3 months with a rectal temperature of 100.5 F (  38.1 C) or higher for more than 1 day.   Large, tender lumps develop in your neck.   A rash develops.   Green, yellow-Rubano, or bloody substance is coughed up.   You are unable to swallow liquids or food for 24 hours.   Your child is unable to swallow food or liquids for 12 hours.  SEEK IMMEDIATE MEDICAL CARE IF:   You develop any new symptoms such as vomiting, severe headache, stiff neck, chest pain, or trouble breathing or swallowing.   You have severe throat pain along with drooling or voice changes.   You have severe pain, unrelieved with recommended medications.   You are unable to fully open the mouth.   You develop redness, swelling, or severe pain anywhere in the neck.   You have a fever.   Your baby is older than 3 months with a rectal temperature of  102 F (38.9 C) or higher.   Your baby is 36 months old or younger with a rectal temperature of 100.4 F (38 C) or higher.  MAKE SURE YOU:   Understand these instructions.   Will watch your condition.   Will get help right away if you are not doing well or get worse.  Document Released: 04/23/2005 Document Revised: 07/03/2011 Document Reviewed: 09/19/2010 [facility] Patient Information 2012 [place], [place].  RESOURCE GUIDE  Dental Problems  Patients with Medicaid: [facility] [phone]                                           [phone] [address] [employer] [address] Phone:  [phone]                                                  Phone:  [phone]  If unable to pay or uninsured, contact:  [facility] or [facility]. to become qualified for the adult dental clinic.  Chronic Pain Problems Contact [facility]  [phone] Patients need to be referred by their primary care doctor.  Insufficient Money for Medicine Contact [employer]:  call "211" or [facility] [phone].  No Primary Care Doctor Call [facility]  [phone] Other agencies that provide inexpensive medical care    [facility]  [phone]    [facility]  [phone]    [facility]  [phone]    [facility]  [phone]    [facility]  [phone]    [facility]  [phone]  Psychological Services [facility]  [phone] [facility] Services  [phone] [facility]   [phone] (emergency services ([phone])  Substance Abuse Resources Alcohol and Drug Services  206-174-1736 [facility] ([phone] [facility] [phone] Floyd[facility] Flock [phone] Residential & Outpatient Substance Abuse Program  [phone]  Abuse/Neglect [facility] Child Abuse Hotline 202-110-3979 [facility] Child Abuse Hotline [phone]  (After Hours)  Emergency Shelter [facility] 463-462-2322  Maternity Homes [facility] at the [place] [facility] [place] [phone] [facility] Services [phone]  MRSA Hotline #:   (367)887-2577  [facility]  [facility]     [employer]                          [facility] Dept. [address] [place]                       [address]      [address]  Blondell Reveal Phone:  [phone]                                   Phone:  [phone]                 Phone:  [phone]  [facility] Phone:  [phone]  [facility] Child Abuse Hotline [phone] [phone] (After Hours)    Take your next dose of amoxicillin this morning.  Rest,  Drink plenty of fluids.   Keep a close check of your blood glucose levels.  Call your doctor for a recheck within the next several days.  Return here if you think your symptoms are worsening.  You may use the lortab elixer as needed for pain - this will make you sleepy - do not drive within  4 hours of taking this medicine.

## 2012-08-22 ENCOUNTER — Encounter (HOSPITAL_COMMUNITY): Payer: Self-pay | Admitting: *Deleted

## 2012-08-22 ENCOUNTER — Emergency Department (HOSPITAL_COMMUNITY)
Admission: EM | Admit: 2012-08-22 | Discharge: 2012-08-22 | Disposition: A | Discharge status: Home / Self Care | Payer: BC Managed Care – PPO | Attending: Emergency Medicine | Admitting: Emergency Medicine

## 2012-08-22 DIAGNOSIS — F17200 Nicotine dependence, unspecified, uncomplicated: Secondary | ICD-10-CM | POA: Insufficient documentation

## 2012-08-22 DIAGNOSIS — R599 Enlarged lymph nodes, unspecified: Secondary | ICD-10-CM | POA: Insufficient documentation

## 2012-08-22 DIAGNOSIS — J039 Acute tonsillitis, unspecified: Secondary | ICD-10-CM | POA: Insufficient documentation

## 2012-08-22 DIAGNOSIS — E119 Type 2 diabetes mellitus without complications: Secondary | ICD-10-CM | POA: Insufficient documentation

## 2012-08-22 DIAGNOSIS — R509 Fever, unspecified: Secondary | ICD-10-CM | POA: Insufficient documentation

## 2012-08-22 DIAGNOSIS — H9209 Otalgia, unspecified ear: Secondary | ICD-10-CM | POA: Insufficient documentation

## 2012-08-22 DIAGNOSIS — R630 Anorexia: Secondary | ICD-10-CM | POA: Insufficient documentation

## 2012-08-22 LAB — RAPID STREP SCREEN (MED CTR MEBANE ONLY): Streptococcus, Group A Screen (Direct): NEGATIVE

## 2012-08-22 MED ORDER — HYDROCODONE-ACETAMINOPHEN 7.5-325 MG/15ML PO SOLN: 15.0000 mL | Freq: Four times a day (QID) | ORAL | Status: DC | PRN

## 2012-08-22 MED ORDER — ACETAMINOPHEN 325 MG PO TABS
650.0000 mg | ORAL_TABLET | Freq: Once | ORAL | Status: AC
  Administered 2012-08-22: 650 mg via ORAL
  Filled 2012-08-22: qty 2

## 2012-08-22 MED ORDER — AMOXICILLIN 250 MG PO CAPS
500.0000 mg | ORAL_CAPSULE | Freq: Once | ORAL | Status: AC
  Administered 2012-08-22: 500 mg via ORAL
  Filled 2012-08-22: qty 2

## 2012-08-22 MED ORDER — AMOXICILLIN 500 MG PO CAPS: 500.0000 mg | ORAL_CAPSULE | Freq: Three times a day (TID) | ORAL | Status: DC

## 2012-08-22 NOTE — ED Provider Notes (Signed)
History     CSN: [id number]  Arrival date & time 08/22/12  2111   First MD Initiated Contact with Patient 08/22/12 2123      Chief Complaint  Patient presents with  . Sore Throat    (Consider location/radiation/quality/duration/timing/severity/associated sxs/prior treatment) HPI Comments: Patient c/o sore throat for 3 days.  Hx of frequent tonsillitis and states pain feels similar to previous.  He also c/o intermittent fever and right ear pain.  He states that he has not been drinking fluids regularly due to the level of throat pain.  He denies neck pain, vomiting, abd pain or headaches.  He also denies difficulty breathing  Patient is a 27 y.o. male presenting with pharyngitis. The history is provided by the patient.  Sore Throat This is a new problem. The current episode started in the past 7 days. The problem occurs constantly. The problem has been unchanged. Associated symptoms include a fever and a sore throat. Pertinent negatives include no abdominal pain, arthralgias, chest pain, chills, congestion, coughing, headaches, joint swelling, myalgias, nausea, neck pain, numbness, rash, swollen glands, vomiting or weakness. The symptoms are aggravated by swallowing. He has tried nothing for the symptoms.    Past Medical History  Diagnosis Date  . Diabetes mellitus     History reviewed. No pertinent past surgical history.  History reviewed. No pertinent family history.  History  Substance Use Topics  . Smoking status: Current Every Day Smoker  . Smokeless tobacco: Not on file  . Alcohol Use: No      Review of Systems  Constitutional: Positive for fever and appetite change. Negative for chills.  HENT: Positive for ear pain and sore throat. Negative for congestion, facial swelling, rhinorrhea, mouth sores, trouble swallowing, neck pain, neck stiffness and voice change.   Respiratory: Negative for cough and chest tightness.   Cardiovascular: Negative for chest pain.    Gastrointestinal: Negative for nausea, vomiting and abdominal pain.  Musculoskeletal: Negative for myalgias, joint swelling and arthralgias.  Skin: Negative for rash.  Neurological: Negative for weakness, numbness and headaches.  Hematological: Positive for adenopathy.  All other systems reviewed and are negative.    Allergies  Review of patient's allergies indicates no known allergies.  Home Medications   Current Outpatient Rx  Name  Route  Sig  Dispense  Refill  . DIPHENHYDRAMINE HCL 25 MG PO CAPS   Oral   Take 50 mg by mouth as needed. For allergy symptoms         . INSULIN ASPART PROT & ASPART (70-30) 100 UNIT/ML [place] SUSP   Subcutaneous   Inject 40 Units into the skin 2 (two) times daily with a meal.   10 mL   12     BP 152/106  Pulse 119  Temp 98 F (36.7 C) (Oral)  Resp 24  Ht 6\' 4"  (1.93 m)  Wt 295 lb (133.811 kg)  BMI 35.91 kg/m2  SpO2 99%  Physical Exam  Nursing note and vitals reviewed. Constitutional: He is oriented to person, place, and time. He appears well-developed and well-nourished. No distress.  HENT:  Head: Normocephalic and atraumatic. No trismus in the jaw.  Left Ear: Tympanic membrane and ear canal normal.  Mouth/Throat: Uvula is midline and mucous membranes are normal. No uvula swelling. Posterior oropharyngeal edema and posterior oropharyngeal erythema present. No oropharyngeal exudate or tonsillar abscesses.       Right TM not visualized due to cerumen.  Bilateral tonsils are erythematous and edematous with right > left.  No PTA, no exudates.  Airway patent  Eyes: EOM are normal. Pupils are equal, round, and reactive to light.  Neck: Normal range of motion. Neck supple. No Brudzinski's sign and no Kernig's sign noted.  Cardiovascular: Normal rate, regular rhythm, normal heart sounds and intact distal pulses.   No murmur heard. Pulmonary/Chest: Effort normal and breath sounds normal. No respiratory distress. He has no wheezes. He has no  rales. He exhibits no tenderness.  Musculoskeletal: Normal range of motion.  Lymphadenopathy:       Head (right side): No submandibular and no preauricular adenopathy present.       Head (left side): No submandibular and no preauricular adenopathy present.    He has cervical adenopathy.       Right cervical: Superficial cervical adenopathy present.       Left cervical: No superficial cervical adenopathy present.  Neurological: He is alert and oriented to person, place, and time. He exhibits normal muscle tone. Coordination normal.  Skin: Skin is warm and dry.    ED Course  Procedures (including critical care time)  Results for orders placed during the hospital encounter of 08/22/12  RAPID STREP SCREEN      Component Value Range   Streptococcus, Group A Screen (Direct) NEGATIVE  NEGATIVE         MDM     Previous ED charts reviewed.  Airway is patent.  Erythema of the bilateral tonsils.  No PTA, trismus or sublingual abnml.  Hx of frequent tonsillitis.   Pt agrees to rest, fluids and close f/u with his PMD.    Prescribed: Amoxil hycet elixir  Amanuel Sinkfield L. Effa [place], [place] 08/22/12 2351

## 2012-08-22 NOTE — ED Notes (Signed)
Sore throat x 3 days

## 2012-08-23 NOTE — ED Provider Notes (Signed)
Medical screening examination/treatment/procedure(s) were performed by non-physician practitioner and as supervising physician I was immediately available for consultation/collaboration.   Mikyla Schachter L Kaitlen Redford, MD 08/23/12 0049 

## 2012-10-18 ENCOUNTER — Encounter (HOSPITAL_COMMUNITY): Payer: Self-pay | Admitting: Emergency Medicine

## 2012-10-18 ENCOUNTER — Emergency Department (HOSPITAL_COMMUNITY)
Admission: EM | Admit: 2012-10-18 | Discharge: 2012-10-19 | Disposition: A | Discharge status: Home / Self Care | Payer: BC Managed Care – PPO | Attending: Emergency Medicine | Admitting: Emergency Medicine

## 2012-10-18 DIAGNOSIS — N4889 Other specified disorders of penis: Secondary | ICD-10-CM | POA: Insufficient documentation

## 2012-10-18 DIAGNOSIS — R300 Dysuria: Secondary | ICD-10-CM | POA: Insufficient documentation

## 2012-10-18 DIAGNOSIS — R739 Hyperglycemia, unspecified: Secondary | ICD-10-CM

## 2012-10-18 DIAGNOSIS — Z79899 Other long term (current) drug therapy: Secondary | ICD-10-CM | POA: Insufficient documentation

## 2012-10-18 DIAGNOSIS — F17200 Nicotine dependence, unspecified, uncomplicated: Secondary | ICD-10-CM | POA: Insufficient documentation

## 2012-10-18 DIAGNOSIS — E1169 Type 2 diabetes mellitus with other specified complication: Secondary | ICD-10-CM | POA: Insufficient documentation

## 2012-10-18 DIAGNOSIS — N489 Disorder of penis, unspecified: Secondary | ICD-10-CM

## 2012-10-18 LAB — URINALYSIS, ROUTINE W REFLEX MICROSCOPIC
Glucose, UA: >1000 mg/dL — AB
Ketones, ur: NEGATIVE mg/dL
Leukocytes, UA: NEGATIVE
Nitrite: NEGATIVE
Protein, ur: NEGATIVE mg/dL
pH: 6 (ref 5.0–8.0)

## 2012-10-18 LAB — URINE MICROSCOPIC-ADD ON: Urine-Other: NONE SEEN

## 2012-10-18 NOTE — ED Provider Notes (Signed)
History     CSN: [id number]  Arrival date & time 10/18/12  2259   First MD Initiated Contact with Patient 10/18/12 2309      Chief Complaint  Patient presents with  . Dysuria    (Consider location/radiation/quality/duration/timing/severity/associated sxs/prior treatment) HPI Curtis Rich is a 27 y.o. male who presents to the Emergency Department complaining of dysuria, pain with urination x 2 days. He has seen no blood in the urine. Denies fever, chills, flank pain.   PCP  Dr. Daphine Deutscher Past Medical History  Diagnosis Date  . Diabetes mellitus     History reviewed. No pertinent past surgical history.  History reviewed. No pertinent family history.  History  Substance Use Topics  . Smoking status: Current Every Day Smoker  . Smokeless tobacco: Not on file  . Alcohol Use: Yes      Review of Systems  Constitutional: Negative for fever.       10 Systems reviewed and are negative for acute change except as noted in the HPI.  HENT: Negative for congestion.   Eyes: Negative for discharge and redness.  Respiratory: Negative for cough and shortness of breath.   Cardiovascular: Negative for chest pain.  Gastrointestinal: Negative for vomiting and abdominal pain.  Genitourinary: Positive for dysuria.  Musculoskeletal: Negative for back pain.  Skin: Negative for rash.  Neurological: Negative for syncope, numbness and headaches.  Psychiatric/Behavioral:       No behavior change.    Allergies  Review of patient's allergies indicates no known allergies.  Home Medications   Current Outpatient Rx  Name  Route  Sig  Dispense  Refill  . insulin aspart protamine-insulin aspart (NOVOLOG MIX 70/30) (70-30) 100 UNIT/ML injection   Subcutaneous   Inject 40 Units into the skin 2 (two) times daily with a meal.   10 mL   12   . amoxicillin (AMOXIL) 500 MG capsule   Oral   Take 1 capsule (500 mg total) by mouth 3 (three) times daily.   21 capsule   0   . diphenhydrAMINE  (BENADRYL) 25 mg capsule   Oral   Take 50 mg by mouth as needed. For allergy symptoms         . HYDROcodone-acetaminophen (HYCET) 7.5-325 mg/15 ml solution   Oral   Take 15 mLs by mouth every 6 (six) hours as needed for pain.   120 mL   0     BP 147/94  Pulse 86  Temp(Src) 98.7 F (37.1 C) (Oral)  Resp 17  Ht 6\' 3"  (1.905 m)  Wt 300 lb (136.079 kg)  BMI 37.5 kg/m2  SpO2 97%  Physical Exam  Nursing note and vitals reviewed. Constitutional: He appears well-developed and well-nourished.  Awake, alert, nontoxic appearance.  HENT:  Head: Normocephalic and atraumatic.  Eyes: Right eye exhibits no discharge. Left eye exhibits no discharge.  Neck: Neck supple.  Cardiovascular: Normal rate and intact distal pulses.   Pulmonary/Chest: Effort normal and breath sounds normal. He exhibits no tenderness.  Abdominal: Soft. Bowel sounds are normal. There is no tenderness. There is no rebound.  Genitourinary:  No cva tenderness  Musculoskeletal: He exhibits no tenderness.  Baseline ROM, no obvious new focal weakness.  Neurological:  Mental status and motor strength appears baseline for patient and situation.  Skin: No rash noted.  Psychiatric: He has a normal mood and affect.    ED Course  Procedures (including critical care time) Results for orders placed during the hospital encounter of 10/18/12  URINALYSIS, ROUTINE W REFLEX MICROSCOPIC      Result Value Range   Color, Urine YELLOW  YELLOW   APPearance CLEAR  CLEAR   Specific Gravity, Urine 1.015  1.005 - 1.030   pH 6.0  5.0 - 8.0   Glucose, UA >1000 (*) NEGATIVE mg/dL   Hgb urine dipstick NEGATIVE  NEGATIVE   Bilirubin Urine NEGATIVE  NEGATIVE   Ketones, ur NEGATIVE  NEGATIVE mg/dL   Protein, ur NEGATIVE  NEGATIVE mg/dL   Urobilinogen, UA 0.2  0.0 - 1.0 mg/dL   Nitrite NEGATIVE  NEGATIVE   Leukocytes, UA NEGATIVE  NEGATIVE  URINE MICROSCOPIC-ADD ON      Result Value Range   Urine-Other       Value: NO FORMED  ELEMENTS SEEN ON URINE MICROSCOPIC EXAMINATION  GLUCOSE, CAPILLARY      Result Value Range   Glucose-Capillary 307 (*) 70 - 99 mg/dL       MDM   Diabetic patient with dysuria. Glucose is elevated and last time he took his insulin was this morning. Urine with glucose. Blister on his penis is self limited. Pt stable in ED with no significant deterioration in condition.The patient appears reasonably screened and/or stabilized for discharge and I doubt any other medical condition or other [facility] requiring further screening, evaluation, or treatment in the ED at this time prior to discharge.  MDM Reviewed: nursing note and vitals Interpretation: labs        Nicoletta Dress. Colon Branch, MD 10/19/12 [phone]

## 2012-10-18 NOTE — ED Notes (Signed)
Pt c/o burning while urinating x 2 days.

## 2012-10-19 NOTE — ED Provider Notes (Addendum)
History     CSN: [id number]  Arrival date & time 10/18/12  2259   First MD Initiated Contact with Patient 10/18/12 2309      Chief Complaint  Patient presents with  . Dysuria    (Consider location/radiation/quality/duration/timing/severity/associated sxs/prior treatment) HPI Carless A Frenette is a 27 y.o. male  Diabetic who presents to the Emergency Department complaining of dysuria x 12 days. He also has a blister on his penis.   PCP  Dr. Daphine Deutscher Past Medical History  Diagnosis Date  . Diabetes mellitus     History reviewed. No pertinent past surgical history.  History reviewed. No pertinent family history.  History  Substance Use Topics  . Smoking status: Current Every Day Smoker  . Smokeless tobacco: Not on file  . Alcohol Use: Yes      Review of Systems  Constitutional: Negative for fever.       10 Systems reviewed and are negative for acute change except as noted in the HPI.  HENT: Negative for congestion.   Eyes: Negative for discharge and redness.  Respiratory: Negative for cough and shortness of breath.   Cardiovascular: Negative for chest pain.  Gastrointestinal: Negative for vomiting and abdominal pain.  Genitourinary: Positive for dysuria.  Musculoskeletal: Negative for back pain.  Skin: Negative for rash.  Neurological: Negative for syncope, numbness and headaches.  Psychiatric/Behavioral:       No behavior change.    Allergies  Review of patient's allergies indicates no known allergies.  Home Medications   Current Outpatient Rx  Name  Route  Sig  Dispense  Refill  . EXPIRED: insulin aspart protamine-insulin aspart (NOVOLOG MIX 70/30) (70-30) 100 UNIT/ML injection   Subcutaneous   Inject 40 Units into the skin 2 (two) times daily with a meal.   10 mL   12   . amoxicillin (AMOXIL) 500 MG capsule   Oral   Take 1 capsule (500 mg total) by mouth 3 (three) times daily.   21 capsule   0   . diphenhydrAMINE (BENADRYL) 25 mg capsule   Oral   Take  50 mg by mouth as needed. For allergy symptoms         . HYDROcodone-acetaminophen (HYCET) 7.5-325 mg/15 ml solution   Oral   Take 15 mLs by mouth every 6 (six) hours as needed for pain.   120 mL   0     BP 147/94  Pulse 86  Temp(Src) 98.7 F (37.1 C) (Oral)  Resp 17  Ht 6\' 3"  (1.905 m)  Wt 300 lb (136.079 kg)  BMI 37.5 kg/m2  SpO2 97%  Physical Exam  Nursing note and vitals reviewed. Constitutional: He appears well-developed and well-nourished.  Awake, alert, nontoxic appearance.  HENT:  Head: Atraumatic.  Eyes: Right eye exhibits no discharge. Left eye exhibits no discharge.  Neck: Neck supple.  Cardiovascular: Normal rate and intact distal pulses.   Pulmonary/Chest: Effort normal and breath sounds normal. He exhibits no tenderness.  Abdominal: Soft. Bowel sounds are normal. There is no tenderness. There is no rebound.  Genitourinary:  Blister on the penis  Musculoskeletal: He exhibits no tenderness.  Baseline ROM, no obvious new focal weakness.  Neurological:  Mental status and motor strength appears baseline for patient and situation.  Skin: No rash noted.  Psychiatric: He has a normal mood and affect.    ED Course  Procedures (including critical care time) Results for orders placed during the hospital encounter of 10/18/12  URINALYSIS, ROUTINE W REFLEX MICROSCOPIC  Result Value Range   Color, Urine YELLOW  YELLOW   APPearance CLEAR  CLEAR   Specific Gravity, Urine 1.015  1.005 - 1.030   pH 6.0  5.0 - 8.0   Glucose, UA >1000 (*) NEGATIVE mg/dL   Hgb urine dipstick NEGATIVE  NEGATIVE   Bilirubin Urine NEGATIVE  NEGATIVE   Ketones, ur NEGATIVE  NEGATIVE mg/dL   Protein, ur NEGATIVE  NEGATIVE mg/dL   Urobilinogen, UA 0.2  0.0 - 1.0 mg/dL   Nitrite NEGATIVE  NEGATIVE   Leukocytes, UA NEGATIVE  NEGATIVE  URINE MICROSCOPIC-ADD ON      Result Value Range   Urine-Other       Value: NO FORMED ELEMENTS SEEN ON URINE MICROSCOPIC EXAMINATION  GLUCOSE,  CAPILLARY      Result Value Range   Glucose-Capillary 307 (*) 70 - 99 mg/dL       MDM  Diabetic patient with dysuria. Glucose is elevated and last time he took his insulin was this morning. Urine with glucose. Blister on his penis is self limited. Pt stable in ED with no significant deterioration in condition.The patient appears reasonably screened and/or stabilized for discharge and I doubt any other medical condition or other [facility] requiring further screening, evaluation, or treatment in the ED at this time prior to discharge.  MDM Reviewed: nursing note and vitals Interpretation: labs           Nicoletta Dress. Colon Branch, MD 10/19/12 1610  Nicoletta Dress. Colon Branch, MD 10/19/12 [phone]

## 2013-02-18 ENCOUNTER — Emergency Department (HOSPITAL_COMMUNITY)
Admission: EM | Admit: 2013-02-18 | Discharge: 2013-02-18 | Disposition: A | Discharge status: Home / Self Care | Payer: BC Managed Care – PPO | Attending: Emergency Medicine | Admitting: Emergency Medicine

## 2013-02-18 ENCOUNTER — Encounter (HOSPITAL_COMMUNITY): Payer: Self-pay | Admitting: Emergency Medicine

## 2013-02-18 DIAGNOSIS — E119 Type 2 diabetes mellitus without complications: Secondary | ICD-10-CM | POA: Insufficient documentation

## 2013-02-18 DIAGNOSIS — J029 Acute pharyngitis, unspecified: Secondary | ICD-10-CM | POA: Insufficient documentation

## 2013-02-18 DIAGNOSIS — Z794 Long term (current) use of insulin: Secondary | ICD-10-CM | POA: Insufficient documentation

## 2013-02-18 DIAGNOSIS — F17200 Nicotine dependence, unspecified, uncomplicated: Secondary | ICD-10-CM | POA: Insufficient documentation

## 2013-02-18 DIAGNOSIS — Z792 Long term (current) use of antibiotics: Secondary | ICD-10-CM | POA: Insufficient documentation

## 2013-02-18 DIAGNOSIS — Z79899 Other long term (current) drug therapy: Secondary | ICD-10-CM | POA: Insufficient documentation

## 2013-02-18 LAB — RAPID STREP SCREEN (MED CTR MEBANE ONLY): Streptococcus, Group A Screen (Direct): NEGATIVE

## 2013-02-18 MED ORDER — AMOXICILLIN 250 MG PO CAPS
500.0000 mg | ORAL_CAPSULE | Freq: Once | ORAL | Status: AC
  Administered 2013-02-18: 500 mg via ORAL
  Filled 2013-02-18: qty 2

## 2013-02-18 MED ORDER — AMOXICILLIN-POT CLAVULANATE 400-57 MG PO CHEW: 1.0000 | CHEWABLE_TABLET | Freq: Two times a day (BID) | ORAL | Status: DC

## 2013-02-18 NOTE — ED Notes (Signed)
Patient states he needs his tonsils out, but hasn't scheduled surgery.  Patient presents tonight with c/o sore throat x 4 days.

## 2013-02-18 NOTE — ED Provider Notes (Signed)
CSN: [id number]     Arrival date & time 02/18/13  0056 History     First MD Initiated Contact with Patient 02/18/13 0153     Chief Complaint  Patient presents with  . Sore Throat   (Consider location/radiation/quality/duration/timing/severity/associated sxs/prior Treatment) HPI HPI Comments: Curtis Rich is a 27 y.o. male who presents to the Emergency Department complaining of sore throat and swollen tonsils x 4 days. He is supposed to get his tonsils out but has not scheduled a date.He denies fever, chills. He is not having difficulty swallowing but it hurts when he swallows.  PCP Alwyn Pea   Past Medical History  Diagnosis Date  . Diabetes mellitus    History reviewed. No pertinent past surgical history. No family history on file. History  Substance Use Topics  . Smoking status: Current Every Day Smoker  . Smokeless tobacco: Not on file  . Alcohol Use: No    Review of Systems  HENT: Positive for sore throat.     Allergies  Review of patient's allergies indicates no known allergies.  Home Medications   Current Outpatient Rx  Name  Route  Sig  Dispense  Refill  . insulin detemir (LEVEMIR) 100 UNIT/ML injection   Subcutaneous   Inject 60 Units into the skin at bedtime.         Marland Kitchen amoxicillin (AMOXIL) 500 MG capsule   Oral   Take 1 capsule (500 mg total) by mouth 3 (three) times daily.   21 capsule   0   . amoxicillin-clavulanate (AUGMENTIN) 400-57 MG per chewable tablet   Oral   Chew 1 tablet by mouth 2 (two) times daily.   10 tablet   0   . diphenhydrAMINE (BENADRYL) 25 mg capsule   Oral   Take 50 mg by mouth as needed. For allergy symptoms         . HYDROcodone-acetaminophen (HYCET) 7.5-325 mg/15 ml solution   Oral   Take 15 mLs by mouth every 6 (six) hours as needed for pain.   120 mL   0   . EXPIRED: insulin aspart protamine-insulin aspart (NOVOLOG MIX 70/30) (70-30) 100 UNIT/ML injection   Subcutaneous   Inject 40 Units into the skin 2  (two) times daily with a meal.   10 mL   12    BP 135/88  Pulse 96  Temp(Src) 98.5 F (36.9 C) (Oral)  Resp 18  Ht 6\' 3"  (1.905 m)  Wt 300 lb (136.079 kg)  BMI 37.5 kg/m2  SpO2 97% Physical Exam  Nursing note and vitals reviewed. Constitutional: He appears well-developed and well-nourished.  Awake, alert, nontoxic appearance.  HENT:  Head: Normocephalic and atraumatic.  Enlarged tonsils, erythematous, no exudate  Eyes: Right eye exhibits no discharge. Left eye exhibits no discharge.  Neck: Neck supple.  Cardiovascular: Normal rate and intact distal pulses.   Pulmonary/Chest: Effort normal and breath sounds normal. He exhibits no tenderness.  Abdominal: Soft. There is no tenderness. There is no rebound.  Musculoskeletal: He exhibits no tenderness.  Baseline ROM, no obvious new focal weakness.  Neurological:  Mental status and motor strength appears baseline for patient and situation.  Skin: No rash noted.  Psychiatric: He has a normal mood and affect.    ED Course   Procedures (including critical care time) Results for orders placed during the hospital encounter of 02/18/13  RAPID STREP SCREEN      Result Value Range   Streptococcus, Group A Screen (Direct) NEGATIVE  NEGATIVE  No results found. 1. ST (sore throat)     MDM  Patient with sore throat and enlarged tonsils. Very difficult to get swab which was negative for strep. Placed on amoxicillin. Encouraged to follow up with surgery. Pt stable in ED with no significant deterioration in condition.The patient appears reasonably screened and/or stabilized for discharge and I doubt any other medical condition or other [facility] requiring further screening, evaluation, or treatment in the ED at this time prior to discharge.  MDM Reviewed: nursing note and vitals Interpretation: labs     Nicoletta Dress. Colon Branch, MD 02/18/13 [phone]

## 2013-02-20 LAB — CULTURE, GROUP A STREP

## 2014-07-06 ENCOUNTER — Ambulatory Visit
Admission: RE | Admit: 2014-07-06 | Discharge: 2014-07-06 | Disposition: A | Discharge status: Home / Self Care | Payer: Self-pay | Source: Ambulatory Visit | Attending: Family Medicine | Admitting: Family Medicine

## 2014-07-06 ENCOUNTER — Other Ambulatory Visit: Payer: Self-pay | Admitting: Family Medicine

## 2014-07-06 DIAGNOSIS — M25511 Pain in right shoulder: Secondary | ICD-10-CM

## 2015-01-21 ENCOUNTER — Encounter (HOSPITAL_COMMUNITY): Payer: Self-pay | Admitting: Emergency Medicine

## 2015-01-21 ENCOUNTER — Emergency Department (HOSPITAL_COMMUNITY)
Admission: EM | Admit: 2015-01-21 | Discharge: 2015-01-21 | Disposition: A | Discharge status: Home / Self Care | Payer: Self-pay | Attending: Emergency Medicine | Admitting: Emergency Medicine

## 2015-01-21 DIAGNOSIS — E1165 Type 2 diabetes mellitus with hyperglycemia: Secondary | ICD-10-CM | POA: Insufficient documentation

## 2015-01-21 DIAGNOSIS — J039 Acute tonsillitis, unspecified: Secondary | ICD-10-CM | POA: Insufficient documentation

## 2015-01-21 DIAGNOSIS — Z720 Tobacco use: Secondary | ICD-10-CM | POA: Insufficient documentation

## 2015-01-21 DIAGNOSIS — Z794 Long term (current) use of insulin: Secondary | ICD-10-CM | POA: Insufficient documentation

## 2015-01-21 DIAGNOSIS — Z792 Long term (current) use of antibiotics: Secondary | ICD-10-CM | POA: Insufficient documentation

## 2015-01-21 DIAGNOSIS — R739 Hyperglycemia, unspecified: Secondary | ICD-10-CM

## 2015-01-21 LAB — CBG MONITORING, ED
Glucose-Capillary: 431 mg/dL — ABNORMAL HIGH (ref 65–99)
Glucose-Capillary: 362 mg/dL — ABNORMAL HIGH (ref 65–99)
Glucose-Capillary: 284 mg/dL — ABNORMAL HIGH (ref 65–99)

## 2015-01-21 LAB — CBC WITH DIFFERENTIAL/PLATELET
BASOS ABS: 0 10*3/uL (ref 0.0–0.1)
BASOS PCT: 0 % (ref 0–1)
Eosinophils Absolute: 0.4 10*3/uL (ref 0.0–0.7)
Eosinophils Relative: 2 % (ref 0–5)
HEMATOCRIT: 42.7 % (ref 39.0–52.0)
Hemoglobin: 14.9 g/dL (ref 13.0–17.0)
LYMPHS ABS: 2.4 10*3/uL (ref 0.7–4.0)
Lymphocytes Relative: 16 % (ref 12–46)
MCH: 26.4 pg (ref 26.0–34.0)
MCHC: 34.9 g/dL (ref 30.0–36.0)
MCV: 75.6 fL — AB (ref 78.0–100.0)
MONO ABS: 1.2 10*3/uL — AB (ref 0.1–1.0)
Monocytes Relative: 8 % (ref 3–12)
NEUTROS PCT: 74 % (ref 43–77)
Neutro Abs: 11.5 10*3/uL — ABNORMAL HIGH (ref 1.7–7.7)
PLATELETS: 393 10*3/uL (ref 150–400)
RBC: 5.65 MIL/uL (ref 4.22–5.81)
RDW: 12.8 % (ref 11.5–15.5)
WBC: 15.5 10*3/uL — ABNORMAL HIGH (ref 4.0–10.5)

## 2015-01-21 LAB — BASIC METABOLIC PANEL
Anion gap: 11 (ref 5–15)
BUN: 9 mg/dL (ref 6–20)
CALCIUM: 9.1 mg/dL (ref 8.9–10.3)
CHLORIDE: 92 mmol/L — AB (ref 101–111)
CO2: 26 mmol/L (ref 22–32)
CREATININE: 1.03 mg/dL (ref 0.61–1.24)
GLUCOSE: 420 mg/dL — AB (ref 65–99)
Potassium: 3.9 mmol/L (ref 3.5–5.1)
SODIUM: 129 mmol/L — AB (ref 135–145)

## 2015-01-21 MED ORDER — SODIUM CHLORIDE 0.9 % IV BOLUS (SEPSIS)
1000.0000 mL | Freq: Once | INTRAVENOUS | Status: AC
  Administered 2015-01-21: 1000 mL via INTRAVENOUS

## 2015-01-21 MED ORDER — KETOROLAC TROMETHAMINE 30 MG/ML IJ SOLN
30.0000 mg | Freq: Once | INTRAMUSCULAR | Status: AC
  Administered 2015-01-21: 30 mg via INTRAVENOUS
  Filled 2015-01-21: qty 1

## 2015-01-21 MED ORDER — INSULIN ASPART PROT & ASPART (70-30 MIX) 100 UNIT/ML ~~LOC~~ SUSP
40.0000 [IU] | Freq: Once | SUBCUTANEOUS | Status: AC
  Administered 2015-01-21: 40 [IU] via SUBCUTANEOUS
  Filled 2015-01-21: qty 10

## 2015-01-21 MED ORDER — LIDOCAINE VISCOUS 2 % MT SOLN
15.0000 mL | Freq: Once | OROMUCOSAL | Status: AC
  Administered 2015-01-21: 15 mL via OROMUCOSAL
  Filled 2015-01-21: qty 15

## 2015-01-21 MED ORDER — AMOXICILLIN 250 MG/5ML PO SUSR: 500.0000 mg | Freq: Three times a day (TID) | ORAL | Status: DC

## 2015-01-21 MED ORDER — HYDROCODONE-ACETAMINOPHEN 7.5-325 MG/15ML PO SOLN: 10.0000 mL | Freq: Four times a day (QID) | ORAL | Status: DC | PRN

## 2015-01-21 MED ORDER — AMOXICILLIN 250 MG/5ML PO SUSR
500.0000 mg | Freq: Once | ORAL | Status: AC
  Administered 2015-01-21: 500 mg via ORAL
  Filled 2015-01-21: qty 10

## 2015-01-21 NOTE — Discharge Instructions (Signed)
Hyperglycemia °Hyperglycemia occurs when the glucose (sugar) in your blood is too high. Hyperglycemia can happen for many reasons, but it most often happens to people who do not know they have diabetes or are not managing their diabetes properly.  °CAUSES  °Whether you have diabetes or not, there are other causes of hyperglycemia. Hyperglycemia can occur when you have diabetes, but it can also occur in other situations that you might not be as aware of, such as: °Diabetes °· If you have diabetes and are having problems controlling your blood glucose, hyperglycemia could occur because of some of the following reasons: °· Not following your meal plan. °· Not taking your diabetes medications or not taking it properly. °· Exercising less or doing less activity than you normally do. °· Being sick. °Pre-diabetes °· This cannot be ignored. Before people develop Type 2 diabetes, they almost always have "pre-diabetes." This is when your blood glucose levels are higher than normal, but not yet high enough to be diagnosed as diabetes. Research has shown that some long-term damage to the body, especially the heart and circulatory system, may already be occurring during pre-diabetes. If you take action to manage your blood glucose when you have pre-diabetes, you may delay or prevent Type 2 diabetes from developing. °Stress °· If you have diabetes, you may be "diet" controlled or on oral medications or insulin to control your diabetes. However, you may find that your blood glucose is higher than usual in the hospital whether you have diabetes or not. This is often referred to as "stress hyperglycemia." Stress can elevate your blood glucose. This happens because of hormones put out by the body during times of stress. If stress has been the cause of your high blood glucose, it can be followed regularly by your caregiver. That way he/she can make sure your hyperglycemia does not continue to get worse or progress to  diabetes. °Steroids °· Steroids are medications that act on the infection fighting system (immune system) to block inflammation or infection. One side effect can be a rise in blood glucose. Most people can produce enough extra insulin to allow for this rise, but for those who cannot, steroids make blood glucose levels go even higher. It is not unusual for steroid treatments to "uncover" diabetes that is developing. It is not always possible to determine if the hyperglycemia will go away after the steroids are stopped. A special blood test called an A1c is sometimes done to determine if your blood glucose was elevated before the steroids were started. °SYMPTOMS °· Thirsty. °· Frequent urination. °· Dry mouth. °· Blurred vision. °· Tired or fatigue. °· Weakness. °· Sleepy. °· Tingling in feet or leg. °DIAGNOSIS  °Diagnosis is made by monitoring blood glucose in one or all of the following ways: °· A1c test. This is a chemical found in your blood. °· Fingerstick blood glucose monitoring. °· Laboratory results. °TREATMENT  °First, knowing the cause of the hyperglycemia is important before the hyperglycemia can be treated. Treatment may include, but is not be limited to: °· Education. °· Change or adjustment in medications. °· Change or adjustment in meal plan. °· Treatment for an illness, infection, etc. °· More frequent blood glucose monitoring. °· Change in exercise plan. °· Decreasing or stopping steroids. °· Lifestyle changes. °HOME CARE INSTRUCTIONS  °· Test your blood glucose as directed. °· Exercise regularly. Your caregiver will give you instructions about exercise. Pre-diabetes or diabetes which comes on with stress is helped by exercising. °· Eat wholesome,   balanced meals. Eat often and at regular, fixed times. Your caregiver or nutritionist will give you a meal plan to guide your sugar intake.  Being at an ideal weight is important. If needed, losing as little as 10 to 15 pounds may help improve blood  glucose levels. SEEK MEDICAL CARE IF:   You have questions about medicine, activity, or diet.  You continue to have symptoms (problems such as increased thirst, urination, or weight gain). SEEK IMMEDIATE MEDICAL CARE IF:   You are vomiting or have diarrhea.  Your breath smells fruity.  You are breathing faster or slower.  You are very sleepy or incoherent.  You have numbness, tingling, or pain in your feet or hands.  You have chest pain.  Your symptoms get worse even though you have been following your caregiver's orders.  If you have any other questions or concerns. Document Released: 01/07/2001 Document Revised: 10/06/2011 Document Reviewed: 11/10/2011 [facility] Patient Information 2015 [place], [place]. This information is not intended to replace advice given to you by your health care provider. Make sure you discuss any questions you have with your health care provider.  Tonsillitis Tonsillitis is an infection of the throat that causes the tonsils to become red, tender, and swollen. Tonsils are collections of lymphoid tissue at the back of the throat. Each tonsil has crevices (crypts). Tonsils help fight nose and throat infections and keep infection from spreading to other parts of the body for the first 18 months of life.  CAUSES Sudden (acute) tonsillitis is usually caused by infection with streptococcal bacteria. Long-lasting (chronic) tonsillitis occurs when the crypts of the tonsils become filled with pieces of food and bacteria, which makes it easy for the tonsils to become repeatedly infected. SYMPTOMS  Symptoms of tonsillitis include:  A sore throat, with possible difficulty swallowing.  White patches on the tonsils.  Fever.  Tiredness.  New episodes of snoring during sleep, when you did not snore before.  Small, foul-smelling, yellowish-white pieces of material (tonsilloliths) that you occasionally cough up or spit out. The tonsilloliths can also cause you to  have bad breath. DIAGNOSIS Tonsillitis can be diagnosed through a physical exam. Diagnosis can be confirmed with the results of lab tests, including a throat culture. TREATMENT  The goals of tonsillitis treatment include the reduction of the severity and duration of symptoms and prevention of associated conditions. Symptoms of tonsillitis can be improved with the use of steroids to reduce the swelling. Tonsillitis caused by bacteria can be treated with antibiotic medicines. Usually, treatment with antibiotic medicines is started before the cause of the tonsillitis is known. However, if it is determined that the cause is not bacterial, antibiotic medicines will not treat the tonsillitis. If attacks of tonsillitis are severe and frequent, your health care provider may recommend surgery to remove the tonsils (tonsillectomy). HOME CARE INSTRUCTIONS   Rest as much as possible and get plenty of sleep.  Drink plenty of fluids. While the throat is very sore, eat soft foods or liquids, such as sherbet, soups, or instant breakfast drinks.  Eat frozen ice pops.  Gargle with a warm or cold liquid to help soothe the throat. Mix 1/4 teaspoon of salt and 1/4 teaspoon of baking soda in 8 oz of water. SEEK MEDICAL CARE IF:   Large, tender lumps develop in your neck.  A rash develops.  A green, yellow-[place], or bloody substance is coughed up.  You are unable to swallow liquids or food for 24 hours.  You notice that  only one of the tonsils is swollen. SEEK IMMEDIATE MEDICAL CARE IF:   You develop any new symptoms such as vomiting, severe headache, stiff neck, chest pain, or trouble breathing or swallowing.  You have severe throat pain along with drooling or voice changes.  You have severe pain, unrelieved with recommended medications.  You are unable to fully open the mouth.  You develop redness, swelling, or severe pain anywhere in the neck.  You have a fever. MAKE SURE YOU:   Understand these  instructions.  Will watch your condition.  Will get help right away if you are not doing well or get worse. Document Released: 04/23/2005 Document Revised: 11/28/2013 Document Reviewed: 12/31/2012 [facility] Patient Information 2015 [place], [place]. This information is not intended to replace advice given to you by your health care provider. Make sure you discuss any questions you have with your health care provider.

## 2015-01-21 NOTE — ED Provider Notes (Signed)
CSN: [id number]     Arrival date & time 01/21/15  1011 History   First MD Initiated Contact with Patient 01/21/15 1043     Chief Complaint  Patient presents with  . Sore Throat     (Consider location/radiation/quality/duration/timing/severity/associated sxs/prior Treatment) The history is provided by the patient.   Curtis Rich is a 29 y.o. male presenting with a sore throat along with swollen tonsils and painful swallowing which started over the past 2 days associated with fevers and generalized body aches which has improved.  He reports a history of tonsillitis every several months, last episode occuring 3-4 months ago.  He had seen Dr. Annalee Genta several years ago for this at which time it was recommended he have a tonsillectomy.  He lost his insurance before he was able to have this completed.  He is a diabetic, reports he did not check his cbg this am (usually does twice daily), nor did he take his insulin this am since he has had reduced oral intake since yesterday.  He denies nausea, vomiting, abdominal pain.  He has taken tylenol without pain relief.      Past Medical History  Diagnosis Date  . Diabetes mellitus    History reviewed. No pertinent past surgical history. History reviewed. No pertinent family history. History  Substance Use Topics  . Smoking status: Current Every Day Smoker -- 0.50 packs/day for 9 years    Types: Cigarettes  . Smokeless tobacco: Never Used  . Alcohol Use: No    Review of Systems  Constitutional: Positive for fever. Negative for chills.  HENT: Positive for sore throat. Negative for congestion, ear pain, rhinorrhea, sinus pressure, trouble swallowing and voice change.   Eyes: Negative for discharge.  Respiratory: Negative for cough, shortness of breath, wheezing and stridor.   Cardiovascular: Negative for chest pain.  Gastrointestinal: Negative for nausea, vomiting and abdominal pain.  Genitourinary: Negative.   Musculoskeletal: Negative.    Skin: Negative.       Allergies  Review of patient's allergies indicates no known allergies.  Home Medications   Prior to Admission medications   Medication Sig Start Date End Date Taking? Authorizing Provider  insulin aspart protamine - aspart (NOVOLOG MIX 70/30 FLEXPEN) (70-30) 100 UNIT/ML FlexPen Inject 40 Units into the skin 2 (two) times daily.   Yes Historical Provider, MD  amoxicillin (AMOXIL) 250 MG/5ML suspension Take 10 mLs (500 mg total) by mouth 3 (three) times daily. 01/21/15   Burgess Amor, PA-C  amoxicillin-clavulanate (AUGMENTIN) 400-57 MG per chewable tablet Chew 1 tablet by mouth 2 (two) times daily. Patient not taking: Reported on 01/21/2015 02/18/13   Annamarie Dawley, MD  diphenhydrAMINE (BENADRYL) 25 mg capsule Take 50 mg by mouth as needed. For allergy symptoms    Historical Provider, MD  HYDROcodone-acetaminophen (HYCET) 7.5-325 mg/15 ml solution Take 10 mLs by mouth 4 (four) times daily as needed for moderate pain. 01/21/15   Burgess Amor, PA-C   BP 136/85 mmHg  Pulse 91  Temp(Src) 98.5 F (36.9 C) (Oral)  Resp 18  Ht  (1.905 m)  Wt 275 lb (124.739 kg)  BMI 34.37 kg/m2  SpO2 100% Physical Exam  Constitutional: He is oriented to person, place, and time. He appears well-developed and well-nourished.  HENT:  Head: Normocephalic and atraumatic.  Right Ear: Tympanic membrane and ear canal normal.  Left Ear: Tympanic membrane and ear canal normal.  Nose: No mucosal edema or rhinorrhea.  Mouth/Throat: Uvula is midline and mucous membranes are  normal. Posterior oropharyngeal edema and posterior oropharyngeal erythema present. No oropharyngeal exudate or tonsillar abscesses.  Bilateral pharyngeal erythema and edema.  2 + tonsillar hypertrophy, uvula midline.  No peritonsillar abscess appreciated.  Eyes: Conjunctivae are normal.  Cardiovascular: Normal rate and normal heart sounds.   Pulmonary/Chest: Effort normal. No respiratory distress. He has no wheezes. He has  no rales.  Abdominal: Soft. There is no tenderness.  Musculoskeletal: Normal range of motion.  Neurological: He is alert and oriented to person, place, and time.  Skin: Skin is warm and dry. No rash noted.  Psychiatric: He has a normal mood and affect.    ED Course  Procedures (including critical care time) Labs Review Labs Reviewed  BASIC METABOLIC PANEL - Abnormal; Notable for the following:    Sodium 129 (*)    Chloride 92 (*)    Glucose, Bld 420 (*)    All other components within normal limits  CBC WITH DIFFERENTIAL/PLATELET - Abnormal; Notable for the following:    WBC 15.5 (*)    MCV 75.6 (*)    Neutro Abs 11.5 (*)    Monocytes Absolute 1.2 (*)    All other components within normal limits  CBG MONITORING, ED - Abnormal; Notable for the following:    Glucose-Capillary 431 (*)    All other components within normal limits  CBG MONITORING, ED - Abnormal; Notable for the following:    Glucose-Capillary 362 (*)    All other components within normal limits  CBG MONITORING, ED - Abnormal; Notable for the following:    Glucose-Capillary 284 (*)    All other components within normal limits    Imaging Review No results found.   EKG Interpretation None      MDM   Final diagnoses:  Acute tonsillitis  Hyperglycemia without ketosis    Patients labs and/or radiological studies were reviewed and considered during the medical decision making and disposition process.  Results were also discussed with patient.  Corrected Na+ for glucose 134.  Pt was given IV fluids,  Missed morning dose of insulin given and his cbg has continued to drop since arrival.  He was encouraged to continue close check on his insulin and to not miss doses if elevated.  Soft food diet, increase fluid intake.  Amoxil and hycet prescribed for infection and pain.  Advised return here for any worsened sx (or call pcp).  Also advised he needs to f/u with his ENT specialist to consider tonsillectomy.  He states  will have insurance within the next 30 days and plans to schedule this.  He is not ketotic, no indication for admission at this time.  Strict return precautions given.  Pt understands and will get rechecked if sx not improving.  He states he already felt better at time of dc.   The patient appears reasonably screened and/or stabilized for discharge and I doubt any other medical condition or other [facility] requiring further screening, evaluation, or treatment in the ED at this time prior to discharge.     Burgess Amor, PA-C 01/21/15 1716  Layla Maw Ward, DO 01/24/15 [phone]

## 2015-01-21 NOTE — ED Notes (Signed)
Patient c/o sore throat with pain upon swelling. Patient reports having fevers and body aches but states fevers have stopped. Denies taking any medication for pain. Per patient is supposed to have tonsils taken out because of intermittent swelling but has been unable to.

## 2015-01-21 NOTE — ED Notes (Signed)
Discharge instructions reviewed. Teachback. Patient given the remainder of Novolog 70/30 per pharmacy instructions.

## 2016-05-21 ENCOUNTER — Emergency Department (HOSPITAL_COMMUNITY)
Admission: EM | Admit: 2016-05-21 | Discharge: 2016-05-21 | Disposition: A | Discharge status: Home / Self Care | Payer: Self-pay | Attending: Emergency Medicine | Admitting: Emergency Medicine

## 2016-05-21 ENCOUNTER — Encounter (HOSPITAL_COMMUNITY): Payer: Self-pay | Admitting: Emergency Medicine

## 2016-05-21 DIAGNOSIS — S39012A Strain of muscle, fascia and tendon of lower back, initial encounter: Secondary | ICD-10-CM | POA: Insufficient documentation

## 2016-05-21 DIAGNOSIS — E119 Type 2 diabetes mellitus without complications: Secondary | ICD-10-CM | POA: Insufficient documentation

## 2016-05-21 DIAGNOSIS — Z794 Long term (current) use of insulin: Secondary | ICD-10-CM | POA: Insufficient documentation

## 2016-05-21 DIAGNOSIS — Y9389 Activity, other specified: Secondary | ICD-10-CM | POA: Insufficient documentation

## 2016-05-21 DIAGNOSIS — Y92410 Unspecified street and highway as the place of occurrence of the external cause: Secondary | ICD-10-CM | POA: Insufficient documentation

## 2016-05-21 DIAGNOSIS — Y999 Unspecified external cause status: Secondary | ICD-10-CM | POA: Insufficient documentation

## 2016-05-21 DIAGNOSIS — F17210 Nicotine dependence, cigarettes, uncomplicated: Secondary | ICD-10-CM | POA: Insufficient documentation

## 2016-05-21 DIAGNOSIS — T148XXA Other injury of unspecified body region, initial encounter: Secondary | ICD-10-CM

## 2016-05-21 NOTE — Discharge Instructions (Signed)
Take Tylenol, Motrin for pain.  Return here or see your Dr. for problems.
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
## 2016-05-31 ENCOUNTER — Emergency Department (HOSPITAL_COMMUNITY)
Admission: EM | Admit: 2016-05-31 | Discharge: 2016-05-31 | Disposition: A | Discharge status: Home / Self Care | Payer: Self-pay | Attending: Emergency Medicine | Admitting: Emergency Medicine

## 2016-05-31 ENCOUNTER — Encounter (HOSPITAL_COMMUNITY): Payer: Self-pay | Admitting: Emergency Medicine

## 2016-05-31 DIAGNOSIS — Z794 Long term (current) use of insulin: Secondary | ICD-10-CM | POA: Insufficient documentation

## 2016-05-31 DIAGNOSIS — Z791 Long term (current) use of non-steroidal anti-inflammatories (NSAID): Secondary | ICD-10-CM | POA: Insufficient documentation

## 2016-05-31 DIAGNOSIS — J029 Acute pharyngitis, unspecified: Secondary | ICD-10-CM | POA: Insufficient documentation

## 2016-05-31 DIAGNOSIS — E1165 Type 2 diabetes mellitus with hyperglycemia: Secondary | ICD-10-CM | POA: Insufficient documentation

## 2016-05-31 DIAGNOSIS — Z79899 Other long term (current) drug therapy: Secondary | ICD-10-CM | POA: Insufficient documentation

## 2016-05-31 DIAGNOSIS — F17210 Nicotine dependence, cigarettes, uncomplicated: Secondary | ICD-10-CM | POA: Insufficient documentation

## 2016-05-31 LAB — URINALYSIS, ROUTINE W REFLEX MICROSCOPIC
Bilirubin Urine: NEGATIVE
Glucose, UA: >1000 mg/dL — AB
KETONES UR: NEGATIVE mg/dL
LEUKOCYTES UA: NEGATIVE
Nitrite: NEGATIVE
PROTEIN: NEGATIVE mg/dL
Specific Gravity, Urine: <1.005 — ABNORMAL LOW (ref 1.005–1.030)
pH: 6 (ref 5.0–8.0)

## 2016-05-31 LAB — URINE MICROSCOPIC-ADD ON

## 2016-05-31 LAB — CBG MONITORING, ED
GLUCOSE-CAPILLARY: 501 mg/dL — AB (ref 65–99)
GLUCOSE-CAPILLARY: 402 mg/dL — AB (ref 65–99)
Glucose-Capillary: 320 mg/dL — ABNORMAL HIGH (ref 65–99)

## 2016-05-31 LAB — CBC
HCT: 44.7 % (ref 39.0–52.0)
Hemoglobin: 15.7 g/dL (ref 13.0–17.0)
MCH: 27 pg (ref 26.0–34.0)
MCHC: 35.1 g/dL (ref 30.0–36.0)
MCV: 76.9 fL — ABNORMAL LOW (ref 78.0–100.0)
PLATELETS: 416 10*3/uL — AB (ref 150–400)
RBC: 5.81 MIL/uL (ref 4.22–5.81)
RDW: 13.2 % (ref 11.5–15.5)
WBC: 15.6 10*3/uL — AB (ref 4.0–10.5)

## 2016-05-31 LAB — BASIC METABOLIC PANEL
Anion gap: 11 (ref 5–15)
BUN: 10 mg/dL (ref 6–20)
CALCIUM: 9.8 mg/dL (ref 8.9–10.3)
CO2: 25 mmol/L (ref 22–32)
CREATININE: 1.29 mg/dL — AB (ref 0.61–1.24)
Chloride: 94 mmol/L — ABNORMAL LOW (ref 101–111)
GFR calc non Af Amer: >60 mL/min (ref >60)
Glucose, Bld: 500 mg/dL — ABNORMAL HIGH (ref 65–99)
Potassium: 4 mmol/L (ref 3.5–5.1)
SODIUM: 130 mmol/L — AB (ref 135–145)

## 2016-05-31 LAB — RAPID STREP SCREEN (MED CTR MEBANE ONLY): Streptococcus, Group A Screen (Direct): NEGATIVE

## 2016-05-31 MED ORDER — INSULIN ASPART 100 UNIT/ML IV SOLN
15.0000 [IU] | Freq: Once | INTRAVENOUS | Status: AC
  Administered 2016-05-31: 15 [IU] via SUBCUTANEOUS

## 2016-05-31 MED ORDER — AMOXICILLIN 500 MG PO CAPS: 500.0000 mg | ORAL_CAPSULE | Freq: Three times a day (TID) | ORAL | 0 refills | Status: DC

## 2016-05-31 MED ORDER — SODIUM CHLORIDE 0.9 % IV BOLUS (SEPSIS)
2000.0000 mL | Freq: Once | INTRAVENOUS | Status: AC
  Administered 2016-05-31: 2000 mL via INTRAVENOUS

## 2016-05-31 NOTE — ED Notes (Signed)
To bedside- Education regarding diabetes compliance, diet and not taking insulin leading to renal and cardiovascular disease- How important taking insulin and managing his condition is in relation to his ability to enjoy the aging process. Pt acknowledges that he needs to be in better control of his condition and reports that he has not known regarding his potential renal complications.

## 2016-05-31 NOTE — Discharge Instructions (Signed)
Drink plenty of fluids.  Follow up with a family md next week for recheck

## 2016-05-31 NOTE — ED Triage Notes (Signed)
Pt reports swollen tonsils x 2-3 days. Pt states he has hx of same. Pt reports being unable to eat but is drinking ok. Pt reports intermittent fever.

## 2016-05-31 NOTE — ED Provider Notes (Addendum)
[facility] Provider Note   CSN: [id number] Arrival date & time: 05/31/16  1632     History   Chief Complaint Chief Complaint  Patient presents with  . Sore Throat  . Hyperglycemia    HPI Curtis Rich is a 30 y.o. male.  Patient complains of a sore throat. Also states that his sugars been elevated    Sore Throat  This is a new problem. The problem occurs constantly. The problem has not changed since onset.Pertinent negatives include no chest pain, no abdominal pain and no headaches. Nothing aggravates the symptoms. Nothing relieves the symptoms. He has tried nothing for the symptoms.    Past Medical History:  Diagnosis Date  . Diabetes mellitus     There are no active problems to display for this patient.   History reviewed. No pertinent surgical history.     Home Medications    Prior to Admission medications   Medication Sig Start Date End Date Taking? Authorizing Provider  acetaminophen (TYLENOL) 500 MG tablet Take 1,000 mg by mouth every 6 (six) hours as needed for moderate pain.   Yes Historical Provider, MD  ibuprofen (ADVIL,MOTRIN) 200 MG tablet Take 400 mg by mouth every 6 (six) hours as needed for headache.   Yes Historical Provider, MD  insulin aspart protamine - aspart (NOVOLOG MIX 70/30 FLEXPEN) (70-30) 100 UNIT/ML FlexPen Inject 40 Units into the skin 2 (two) times daily.   Yes Historical Provider, MD  Multiple Vitamin (MULTIVITAMIN WITH MINERALS) TABS tablet Take 1 tablet by mouth daily.   Yes Historical Provider, MD    Family History Family History  Problem Relation Age of Onset  . Hypertension Father   . Diabetes Other     Social History Social History  Substance Use Topics  . Smoking status: Current Every Day Smoker    Packs/day: 0.50    Years: 9.00    Types: Cigarettes  . Smokeless tobacco: Never Used  . Alcohol use No     Allergies   Review of patient's allergies indicates no known allergies.   Review of Systems Review  of Systems  Constitutional: Negative for appetite change and fatigue.  HENT: Negative for congestion, ear discharge and sinus pressure.        Sore throat  Eyes: Negative for discharge.  Respiratory: Negative for cough.   Cardiovascular: Negative for chest pain.  Gastrointestinal: Negative for abdominal pain and diarrhea.  Genitourinary: Negative for frequency and hematuria.  Musculoskeletal: Negative for back pain.  Skin: Negative for rash.  Neurological: Negative for seizures and headaches.  Psychiatric/Behavioral: Negative for hallucinations.     Physical Exam Updated Vital Signs BP 139/78 (BP Location: Right Arm)   Pulse 67   Temp 99.1 F (37.3 C) (Oral)   Resp 16   Ht 6\' 3"  (1.905 m)   Wt 275 lb (124.7 kg)   SpO2 100%   BMI 34.37 kg/m   Physical Exam  Constitutional: He is oriented to person, place, and time. He appears well-developed.  HENT:  Head: Normocephalic.  Pharynx mildly inflamed with small exudate  Eyes: Conjunctivae and EOM are normal. No scleral icterus.  Neck: Neck supple. No thyromegaly present.  Cardiovascular: Normal rate and regular rhythm.  Exam reveals no gallop and no friction rub.   No murmur heard. Pulmonary/Chest: No stridor. He has no wheezes. He has no rales. He exhibits no tenderness.  Abdominal: He exhibits no distension. There is no tenderness. There is no rebound.  Musculoskeletal: Normal range of  motion. He exhibits no edema.  Lymphadenopathy:    He has no cervical adenopathy.  Neurological: He is oriented to person, place, and time. He exhibits normal muscle tone. Coordination normal.  Skin: No rash noted. No erythema.  Psychiatric: He has a normal mood and affect. His behavior is normal.     ED Treatments / Results  Labs (all labs ordered are listed, but only abnormal results are displayed) Labs Reviewed  BASIC METABOLIC PANEL - Abnormal; Notable for the following:       Result Value   Sodium 130 (*)    Chloride 94 (*)     Glucose, Bld 500 (*)    Creatinine, Ser 1.29 (*)    All other components within normal limits  CBC - Abnormal; Notable for the following:    WBC 15.6 (*)    MCV 76.9 (*)    Platelets 416 (*)    All other components within normal limits  CBG MONITORING, ED - Abnormal; Notable for the following:    Glucose-Capillary 501 (*)    All other components within normal limits  RAPID STREP SCREEN (NOT AT [facility])  URINALYSIS, ROUTINE W REFLEX MICROSCOPIC (NOT AT [facility])  CBG MONITORING, ED    EKG  EKG Interpretation None       Radiology No results found.  Procedures Procedures (including critical care time)  Medications Ordered in ED Medications  sodium chloride 0.9 % bolus 2,000 mL (2,000 mLs Intravenous New Bag/Given 05/31/16 1802)     Initial Impression / Assessment and Plan / ED Course  I have reviewed the triage vital signs and the nursing notes.  Pertinent labs & imaging results that were available during my care of the patient were reviewed by me and considered in my medical decision making (see chart for details).  Clinical Course  Patient with pharyngitis and poor control diabetes. He has been treated with some fluids which has improved his sugar. Patient will be discharged with amoxicillin and is instructed to follow-up with a family doctor this week  Final Clinical Impressions(s) / ED Diagnoses   Final diagnoses:  None    New Prescriptions New Prescriptions   No medications on file     Bethann BerkshireJoseph Gwendalyn Mcgonagle, MD 05/31/16 1858    Bethann BerkshireJoseph Korena Nass, MD 05/31/16 2022

## 2016-06-03 LAB — CULTURE, GROUP A STREP (THRC)

## 2016-09-27 IMAGING — CR DG SHOULDER 2+V*R*
4 series · 4 of 4 positions shown · non-contrast
Comparison: None.

CLINICAL DATA: Right shoulder pain and limited range of motion
since a fall 3 days ago.

EXAM:
RIGHT SHOULDER - 2+ VIEW

[view not recorded (1 of 4)]
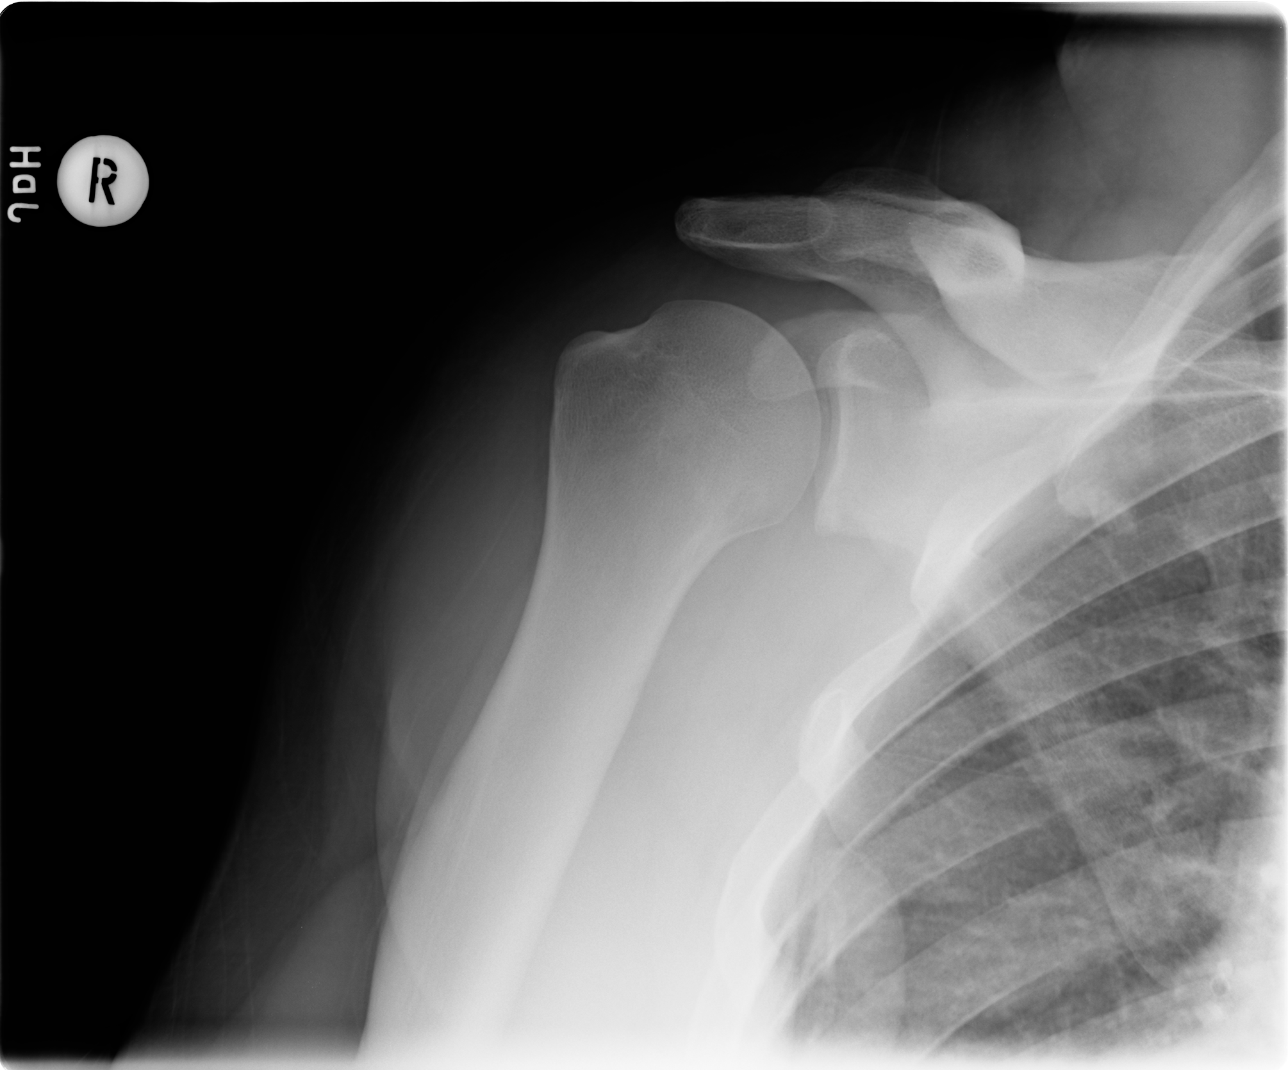

[view not recorded (2 of 4)]
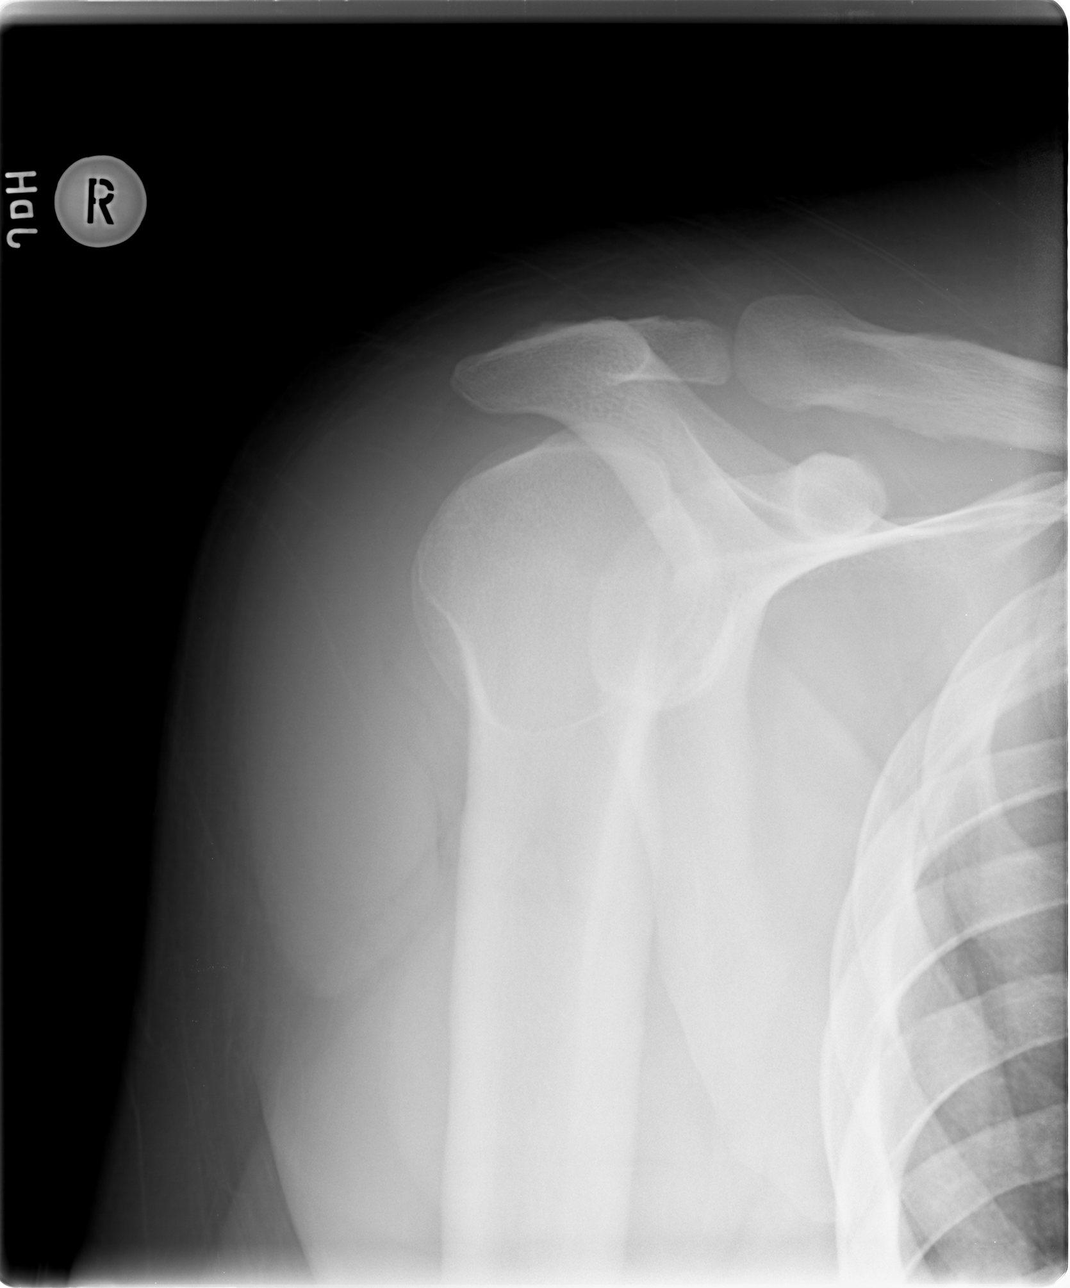

[view not recorded (3 of 4)]
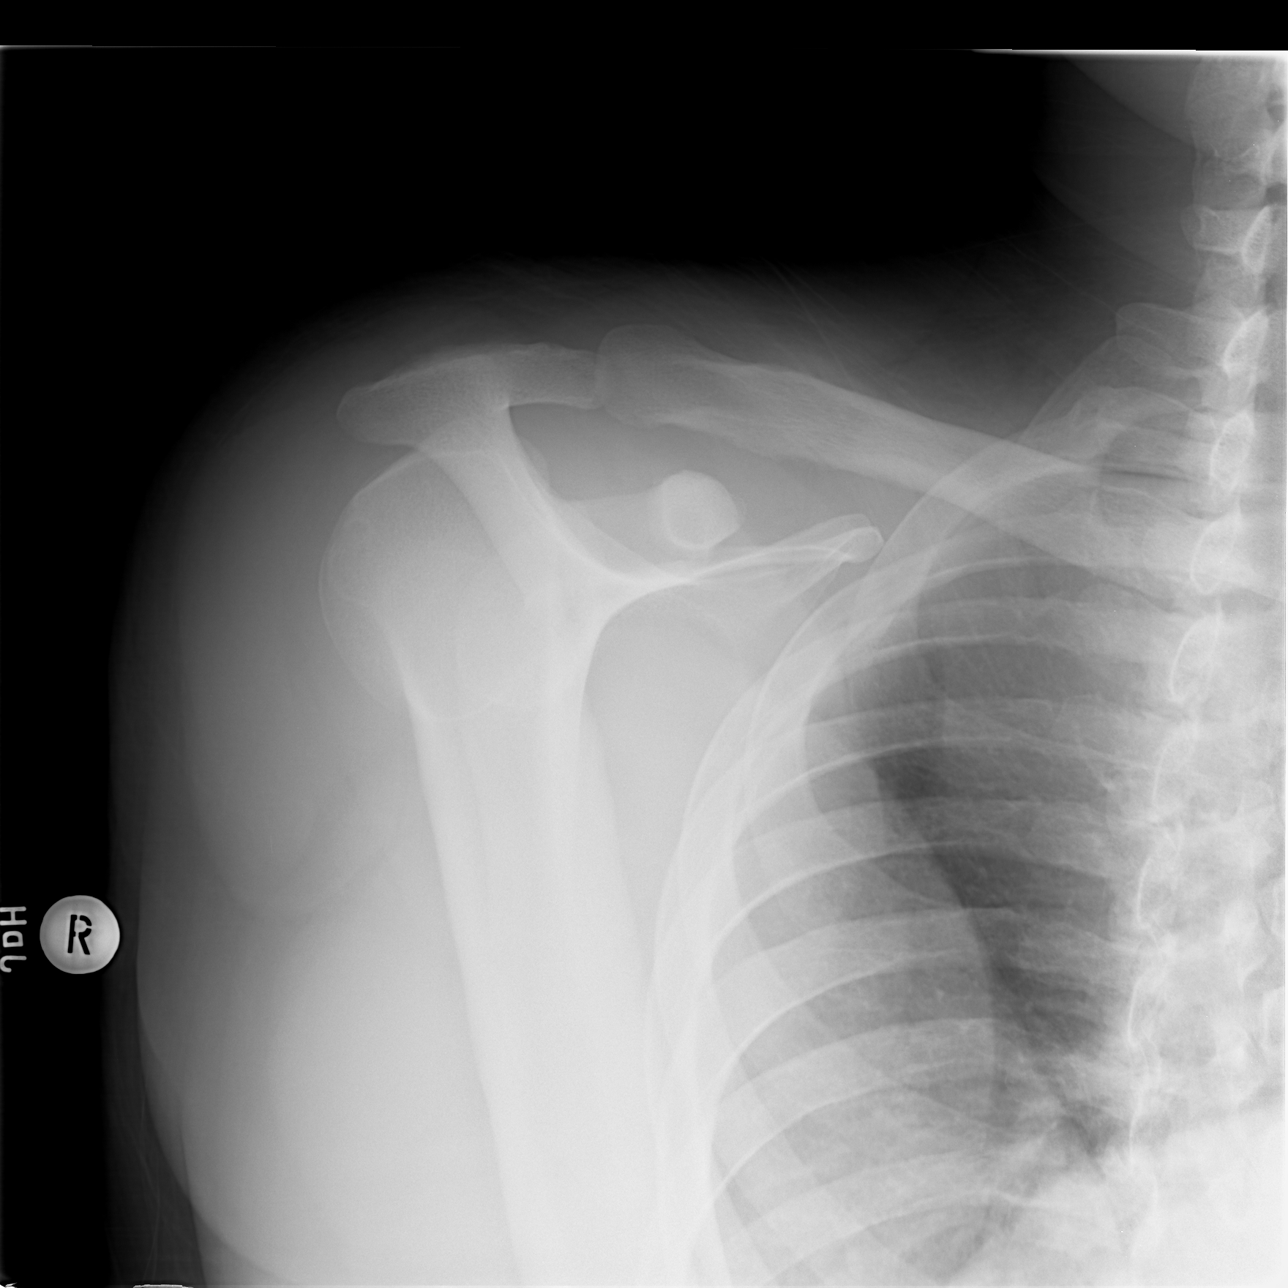

[view not recorded (4 of 4)]
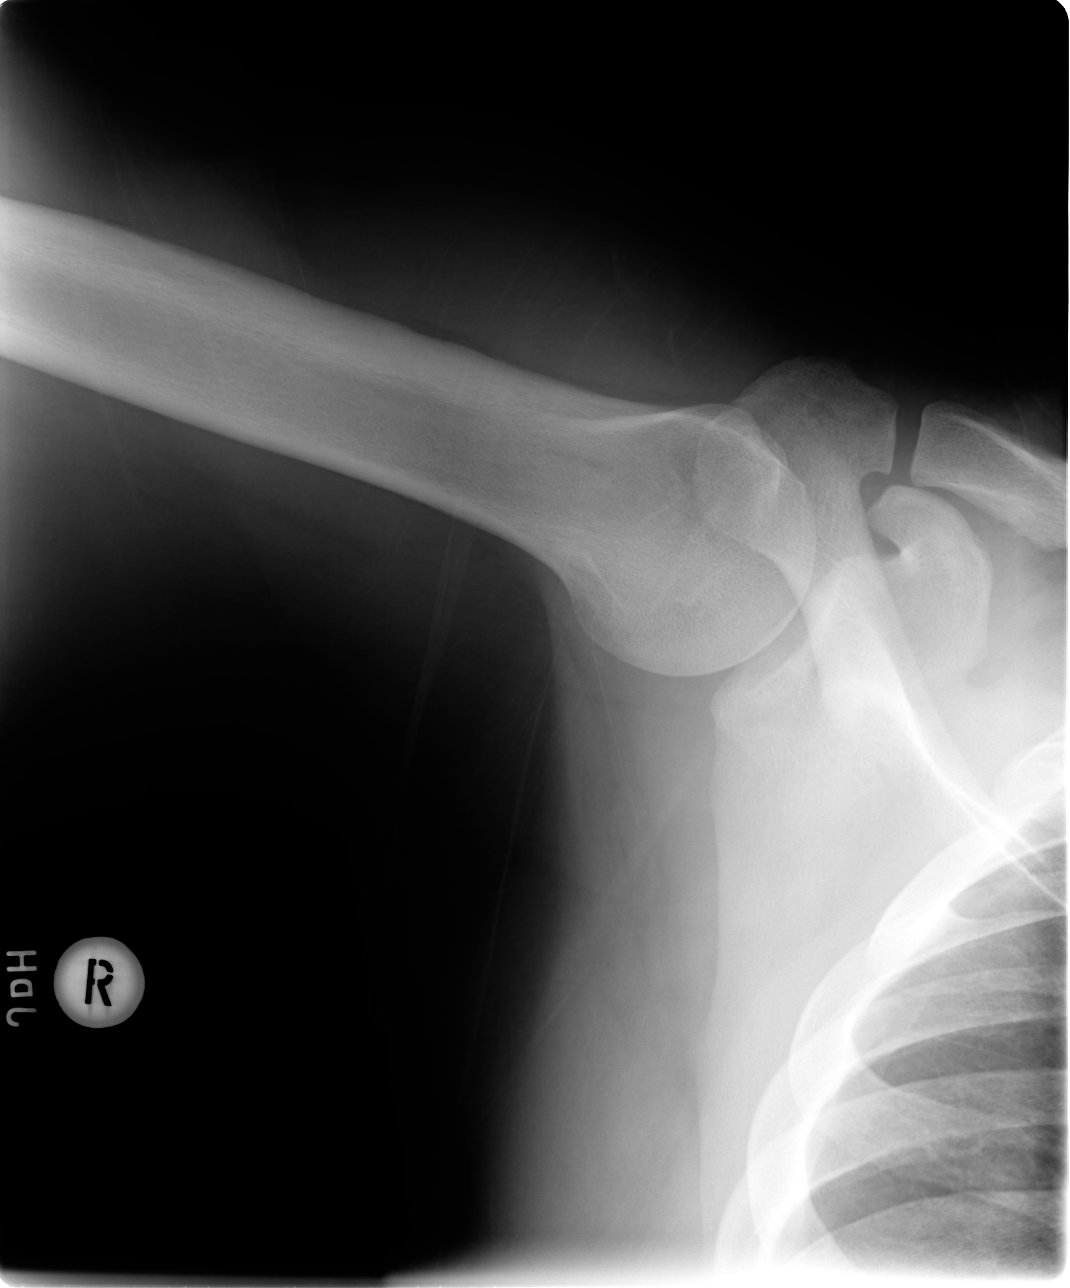

[4 of 4 positions shown; findings below may reference images not displayed]

FINDINGS: There is no evidence of fracture or dislocation. There is no
evidence of arthropathy or other focal bone abnormality. Soft
tissues are unremarkable.
IMPRESSION: Normal exam.

## 2020-11-28 ENCOUNTER — Other Ambulatory Visit (HOSPITAL_COMMUNITY): Payer: Self-pay

## 2020-11-28 MED ORDER — HYDROCHLOROTHIAZIDE 25 MG PO TABS
25.0000 mg | ORAL_TABLET | Freq: Every day | ORAL | 2 refills | Status: DC
  Filled 2020-11-28: qty 90, 90d supply, fill #0

## 2020-11-28 MED ORDER — DILTIAZEM HCL ER COATED BEADS 360 MG PO CP24
360.0000 mg | ORAL_CAPSULE | Freq: Every day | ORAL | 2 refills | Status: DC
  Filled 2020-11-28 (×2): qty 90, 90d supply, fill #0

## 2020-11-29 ENCOUNTER — Other Ambulatory Visit (HOSPITAL_COMMUNITY): Payer: Self-pay

## 2021-03-04 ENCOUNTER — Other Ambulatory Visit (HOSPITAL_COMMUNITY): Payer: Self-pay

## 2021-03-04 MED ORDER — HYDROCHLOROTHIAZIDE 25 MG PO TABS
25.0000 mg | ORAL_TABLET | Freq: Every day | ORAL | 2 refills | Status: DC
  Filled 2021-03-04: qty 90, 90d supply, fill #0

## 2021-03-04 MED ORDER — TRESIBA FLEXTOUCH 100 UNIT/ML ~~LOC~~ SOPN
30.0000 [IU] | PEN_INJECTOR | Freq: Every day | SUBCUTANEOUS | 1 refills | Status: DC
  Filled 2021-03-04: qty 6, 20d supply, fill #0

## 2021-03-04 MED ORDER — TRESIBA FLEXTOUCH 100 UNIT/ML ~~LOC~~ SOPN: 30.0000 [IU] | PEN_INJECTOR | Freq: Every day | SUBCUTANEOUS | 1 refills | Status: DC

## 2021-03-04 MED ORDER — DILTIAZEM HCL ER COATED BEADS 360 MG PO CP24
360.0000 mg | ORAL_CAPSULE | Freq: Every day | ORAL | 2 refills | Status: DC
  Filled 2021-03-04: qty 90, 90d supply, fill #0

## 2021-03-04 MED ORDER — ROSUVASTATIN CALCIUM 20 MG PO TABS
20.0000 mg | ORAL_TABLET | Freq: Every day | ORAL | 3 refills | Status: DC
  Filled 2021-03-04: qty 30, 30d supply, fill #0

## 2021-03-12 ENCOUNTER — Other Ambulatory Visit (HOSPITAL_COMMUNITY): Payer: Self-pay

## 2021-05-27 ENCOUNTER — Ambulatory Visit: Payer: Self-pay | Admitting: Dermatology

## 2021-08-15 ENCOUNTER — Other Ambulatory Visit (HOSPITAL_COMMUNITY): Payer: Self-pay

## 2021-08-15 MED ORDER — TRESIBA FLEXTOUCH 100 UNIT/ML ~~LOC~~ SOPN
30.0000 [IU] | PEN_INJECTOR | Freq: Every day | SUBCUTANEOUS | 1 refills | Status: DC
  Filled 2021-08-15: qty 3, 10d supply, fill #0

## 2021-12-18 ENCOUNTER — Other Ambulatory Visit (HOSPITAL_COMMUNITY): Payer: Self-pay

## 2021-12-18 MED ORDER — HYDROCHLOROTHIAZIDE 25 MG PO TABS
25.0000 mg | ORAL_TABLET | Freq: Every day | ORAL | 1 refills | Status: DC
  Filled 2021-12-18: qty 90, 90d supply, fill #0

## 2021-12-19 ENCOUNTER — Other Ambulatory Visit (HOSPITAL_COMMUNITY): Payer: Self-pay

## 2021-12-19 MED ORDER — DILTIAZEM HCL ER COATED BEADS 360 MG PO CP24
360.0000 mg | ORAL_CAPSULE | Freq: Every day | ORAL | 1 refills | Status: AC
  Filled 2021-12-19 – 2021-12-20 (×2): qty 90, 90d supply, fill #0

## 2021-12-20 ENCOUNTER — Other Ambulatory Visit (HOSPITAL_COMMUNITY): Payer: Self-pay

## 2021-12-28 ENCOUNTER — Encounter: Payer: Self-pay | Admitting: Emergency Medicine

## 2021-12-28 ENCOUNTER — Ambulatory Visit
Admission: EM | Admit: 2021-12-28 | Discharge: 2021-12-28 | Disposition: A | Discharge status: Home / Self Care | Payer: No Typology Code available for payment source | Attending: Family Medicine | Admitting: Family Medicine

## 2021-12-28 ENCOUNTER — Other Ambulatory Visit: Payer: Self-pay

## 2021-12-28 DIAGNOSIS — R062 Wheezing: Secondary | ICD-10-CM

## 2021-12-28 DIAGNOSIS — R059 Cough, unspecified: Secondary | ICD-10-CM

## 2021-12-28 DIAGNOSIS — J069 Acute upper respiratory infection, unspecified: Secondary | ICD-10-CM

## 2021-12-28 MED ORDER — PROMETHAZINE-DM 6.25-15 MG/5ML PO SYRP: 5.0000 mL | ORAL_SOLUTION | Freq: Four times a day (QID) | ORAL | 0 refills | Status: DC | PRN

## 2021-12-28 MED ORDER — ALBUTEROL SULFATE HFA 108 (90 BASE) MCG/ACT IN AERS: 1.0000 | INHALATION_SPRAY | Freq: Four times a day (QID) | RESPIRATORY_TRACT | 0 refills | Status: DC | PRN

## 2021-12-28 NOTE — ED Provider Notes (Signed)
[facility]    CSN: [id number] Arrival date & time: 12/28/21  1431      History   Chief Complaint Chief Complaint  Patient presents with   Cough   Nasal Congestion    HPI Curtis Rich is a 36 y.o. male.   Presenting today with 2-day history of productive cough of green sputum, nasal congestion, fever, chills, body aches, sweats, sore throat, chest tightness, wheezing.  Denies chest pain, abdominal pain, nausea vomiting or diarrhea.  So far taking vitamin C, zinc, over-the-counter cough medicine with no relief.  Denies any known sick contacts, pertinent chronic medical problems.   Past Medical History:  Diagnosis Date   Diabetes mellitus     There are no problems to display for this patient.   History reviewed. No pertinent surgical history.     Home Medications    Prior to Admission medications   Medication Sig Start Date End Date Taking? Authorizing Provider  albuterol (VENTOLIN HFA) 108 (90 Base) MCG/ACT inhaler Inhale 1-2 puffs into the lungs every 6 (six) hours as needed for wheezing or shortness of breath. 12/28/21  Yes Particia NearingLane, Allex Madia Elizabeth, PA-C  diltiazem (CARDIZEM CD) 360 MG 24 hr capsule Take 1 capsule (360 mg total) by mouth daily. 03/04/21  Yes   hydrochlorothiazide (HYDRODIURIL) 25 MG tablet Take 1 tablet (25 mg total) by mouth daily. 11/28/20  Yes   promethazine-dextromethorphan (PROMETHAZINE-DM) 6.25-15 MG/5ML syrup Take 5 mLs by mouth 4 (four) times daily as needed. 12/28/21  Yes Particia NearingLane, Drusilla Wampole Elizabeth, PA-C  acetaminophen (TYLENOL) 500 MG tablet Take 1,000 mg by mouth every 6 (six) hours as needed for moderate pain.    [provider]  amoxicillin (AMOXIL) 500 MG capsule Take 1 capsule (500 mg total) by mouth 3 (three) times daily. 05/31/16   Bethann BerkshireZammit, Joseph, MD  diltiazem (CARDIZEM CD) 360 MG 24 hr capsule Take 1 capsule (360 mg total) by mouth daily. 11/28/20     diltiazem (CARDIZEM CD) 360 MG 24 hr capsule Take 1 capsule (360 mg total) by  mouth daily. 12/19/21     hydrochlorothiazide (HYDRODIURIL) 25 MG tablet Take 1 tablet (25 mg total) by mouth daily. 03/04/21     hydrochlorothiazide (HYDRODIURIL) 25 MG tablet Take 1 tablet (25 mg total) by mouth daily. 12/18/21     ibuprofen (ADVIL,MOTRIN) 200 MG tablet Take 400 mg by mouth every 6 (six) hours as needed for headache.    [provider]  insulin aspart protamine - aspart (NOVOLOG 70/30 MIX) (70-30) 100 UNIT/ML FlexPen Inject 40 Units into the skin 2 (two) times daily.    [provider]  insulin degludec (TRESIBA FLEXTOUCH) 100 UNIT/ML FlexTouch Pen Inject 30 Units into the skin daily. 03/04/21     insulin degludec (TRESIBA FLEXTOUCH) 100 UNIT/ML FlexTouch Pen Inject 30 Units into the skin daily. 03/04/21     insulin degludec (TRESIBA FLEXTOUCH) 100 UNIT/ML FlexTouch Pen Inject 30 Units into the skin daily. 08/15/21     Multiple Vitamin (MULTIVITAMIN WITH MINERALS) TABS tablet Take 1 tablet by mouth daily.    [provider]  rosuvastatin (CRESTOR) 20 MG tablet Take 1 tablet (20 mg total) by mouth daily. 03/04/21       Family History Family History  Problem Relation Age of Onset   Hypertension Father    Diabetes Other     Social History Social History   Tobacco Use   Smoking status: Every Day    Packs/day: 0.50    Years: 9.00  Pack years: 4.50    Types: Cigarettes   Smokeless tobacco: Never  Substance Use Topics   Alcohol use: No   Drug use: No     Allergies   Patient has no known allergies.   Review of Systems Review of Systems Per HPI  Physical Exam Triage Vital Signs ED Triage Vitals  Enc Vitals Group     BP 12/28/21 1458 (!) 138/97     Pulse Rate 12/28/21 1458 97     Resp 12/28/21 1458 18     Temp 12/28/21 1458 99.6 F (37.6 C)     Temp Source 12/28/21 1458 Oral     SpO2 12/28/21 1458 96 %     Weight --      Height --      Head Circumference --      Peak Flow --      Pain Score 12/28/21 1501 6     Pain Loc --       Pain Edu? --      Excl. in GC? --    No data found.  Updated Vital Signs BP (!) 138/97 (BP Location: Right Arm)   Pulse 97   Temp 99.6 F (37.6 C) (Oral)   Resp 18   SpO2 96%   Visual Acuity Right Eye Distance:   Left Eye Distance:   Bilateral Distance:    Right Eye Near:   Left Eye Near:    Bilateral Near:     Physical Exam Vitals and nursing note reviewed.  Constitutional:      Appearance: He is well-developed.  HENT:     Head: Atraumatic.     Right Ear: Tympanic membrane and external ear normal.     Left Ear: Tympanic membrane and external ear normal.     Nose: Rhinorrhea present.     Mouth/Throat:     Pharynx: Posterior oropharyngeal erythema present. No oropharyngeal exudate.  Eyes:     Conjunctiva/sclera: Conjunctivae normal.     Pupils: Pupils are equal, round, and reactive to light.  Cardiovascular:     Rate and Rhythm: Normal rate and regular rhythm.  Pulmonary:     Effort: Pulmonary effort is normal. No respiratory distress.     Breath sounds: Wheezing present. No rales.  Musculoskeletal:        General: Normal range of motion.     Cervical back: Normal range of motion and neck supple.  Lymphadenopathy:     Cervical: No cervical adenopathy.  Skin:    General: Skin is warm and dry.  Neurological:     Mental Status: He is alert and oriented to person, place, and time.  Psychiatric:        Behavior: Behavior normal.     UC Treatments / Results  Labs (all labs ordered are listed, but only abnormal results are displayed) Labs Reviewed  COVID-19, FLU A+B NAA    EKG   Radiology No results found.  Procedures Procedures (including critical care time)  Medications Ordered in UC Medications - No data to display  Initial Impression / Assessment and Plan / UC Course  I have reviewed the triage vital signs and the nursing notes.  Pertinent labs & imaging results that were available during my care of the patient were reviewed by me and  considered in my medical decision making (see chart for details).     Strong suspicion for COVID-19 given symptoms, COVID and flu testing pending, treat symptomatically with Phenergan DM, albuterol inhaler for wheezing as  needed, supportive over-the-counter medications and home care.  Work note given.  Return for worsening symptoms.  Final Clinical Impressions(s) / UC Diagnoses   Final diagnoses:  Cough, unspecified type  Viral URI with cough  Wheezing   Discharge Instructions   None    ED Prescriptions     Medication Sig Dispense Auth. Provider   albuterol (VENTOLIN HFA) 108 (90 Base) MCG/ACT inhaler Inhale 1-2 puffs into the lungs every 6 (six) hours as needed for wheezing or shortness of breath. 18 g Roosvelt Maser [place], [place]   promethazine-dextromethorphan (PROMETHAZINE-DM) 6.25-15 MG/5ML syrup Take 5 mLs by mouth 4 (four) times daily as needed. 100 mL Particia Nearing, [place]      PDMP not reviewed this encounter.   Particia Nearing, [place] 12/28/21 1524

## 2021-12-28 NOTE — ED Triage Notes (Signed)
Patient c/o productive cough w/ "green" sputum and nasal congestion x 2 days.   Patient endorses a fever of 102 F at it's highest at home.   Patient endorses chest tightness at times.   Patient has taken Vitamin C, Zinc, and OTC cough medicine with no relief of symptoms.

## 2021-12-29 LAB — COVID-19, FLU A+B NAA
Influenza A, NAA: NOT DETECTED
Influenza B, NAA: NOT DETECTED
SARS-CoV-2, NAA: NOT DETECTED

## 2022-02-16 ENCOUNTER — Encounter (HOSPITAL_COMMUNITY): Payer: Self-pay

## 2022-02-16 ENCOUNTER — Emergency Department (HOSPITAL_COMMUNITY)
Admission: EM | Admit: 2022-02-16 | Discharge: 2022-02-16 | Disposition: A | Discharge status: Home / Self Care | Payer: No Typology Code available for payment source | Attending: Emergency Medicine | Admitting: Emergency Medicine

## 2022-02-16 ENCOUNTER — Emergency Department (HOSPITAL_COMMUNITY): Payer: No Typology Code available for payment source

## 2022-02-16 DIAGNOSIS — R739 Hyperglycemia, unspecified: Secondary | ICD-10-CM | POA: Insufficient documentation

## 2022-02-16 DIAGNOSIS — R55 Syncope and collapse: Secondary | ICD-10-CM | POA: Insufficient documentation

## 2022-02-16 DIAGNOSIS — S0990XA Unspecified injury of head, initial encounter: Secondary | ICD-10-CM | POA: Insufficient documentation

## 2022-02-16 DIAGNOSIS — X58XXXA Exposure to other specified factors, initial encounter: Secondary | ICD-10-CM | POA: Insufficient documentation

## 2022-02-16 DIAGNOSIS — R0789 Other chest pain: Secondary | ICD-10-CM | POA: Insufficient documentation

## 2022-02-16 LAB — CBC WITH DIFFERENTIAL/PLATELET
Abs Immature Granulocytes: 0.05 10*3/uL (ref 0.00–0.07)
Basophils Absolute: 0 10*3/uL (ref 0.0–0.1)
Basophils Relative: 0 %
Eosinophils Absolute: 0.2 10*3/uL (ref 0.0–0.5)
Eosinophils Relative: 2 %
HCT: 40.1 % (ref 39.0–52.0)
Hemoglobin: 13.4 g/dL (ref 13.0–17.0)
Immature Granulocytes: 1 %
Lymphocytes Relative: 31 %
Lymphs Abs: 3 10*3/uL (ref 0.7–4.0)
MCH: 25.7 pg — ABNORMAL LOW (ref 26.0–34.0)
MCHC: 33.4 g/dL (ref 30.0–36.0)
MCV: 76.8 fL — ABNORMAL LOW (ref 80.0–100.0)
Monocytes Absolute: 0.8 10*3/uL (ref 0.1–1.0)
Monocytes Relative: 8 %
Neutro Abs: 5.6 10*3/uL (ref 1.7–7.7)
Neutrophils Relative %: 58 %
Platelets: 420 10*3/uL — ABNORMAL HIGH (ref 150–400)
RBC: 5.22 MIL/uL (ref 4.22–5.81)
RDW: 14.4 % (ref 11.5–15.5)
WBC: 9.7 10*3/uL (ref 4.0–10.5)
nRBC: 0 % (ref 0.0–0.2)

## 2022-02-16 LAB — CBG MONITORING, ED: Glucose-Capillary: 276 mg/dL — ABNORMAL HIGH (ref 70–99)

## 2022-02-16 LAB — COMPREHENSIVE METABOLIC PANEL
ALT: 46 U/L — ABNORMAL HIGH (ref 0–44)
AST: 35 U/L (ref 15–41)
Albumin: 3.9 g/dL (ref 3.5–5.0)
Alkaline Phosphatase: 114 U/L (ref 38–126)
Anion gap: 8 (ref 5–15)
BUN: 11 mg/dL (ref 6–20)
CO2: 24 mmol/L (ref 22–32)
Calcium: 8.9 mg/dL (ref 8.9–10.3)
Chloride: 100 mmol/L (ref 98–111)
Creatinine, Ser: 1.2 mg/dL (ref 0.61–1.24)
GFR, Estimated: >60 mL/min (ref >60)
Glucose, Bld: 309 mg/dL — ABNORMAL HIGH (ref 70–99)
Potassium: 4.5 mmol/L (ref 3.5–5.1)
Sodium: 132 mmol/L — ABNORMAL LOW (ref 135–145)
Total Bilirubin: 1 mg/dL (ref 0.3–1.2)
Total Protein: 7.8 g/dL (ref 6.5–8.1)

## 2022-02-16 LAB — TROPONIN I (HIGH SENSITIVITY): Troponin I (High Sensitivity): 6 ng/L (ref <18)

## 2022-02-16 MED ORDER — IBUPROFEN 800 MG PO TABS
800.0000 mg | ORAL_TABLET | Freq: Once | ORAL | Status: AC
  Administered 2022-02-16: 800 mg via ORAL
  Filled 2022-02-16: qty 1

## 2022-02-16 NOTE — ED Triage Notes (Signed)
Pt states that he was exiting his car earlier, started sweating profusely, then faceplanted on his grass. Pt endorses hitting his head. Denies blood thinners. 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

## 2022-02-16 NOTE — ED Provider Notes (Signed)
[facility] EMERGENCY DEPARTMENT Provider Note   CSN: [id number] Arrival date & time: 02/16/22  1211     History  Chief Complaint  Patient presents with   Loss of Consciousness    Curtis Rich is a 36 y.o. male.  HPI 36 year old male presents with syncope.  He states that he got out of his car and was going to his grandmother's house and after walking about 10 feet passed out.  When he first got up out of the car he started sweating and was feeling weak.  He states he has been dealing with some chest tightness across his entire chest for 2 or 3 days.  This did not worsen prior to the passing out.  He fell and hit his head and when he woke up he had a headache and the right side of his head is sore to touch.  No preceding headache or thunderclap headache.  No recent illness such as fevers or vomiting.  No focal weakness or numbness.  Denies any palpitations during the acute event but when he woke up after passing out he felt like his heart has some palpitations transiently.  He does endorse that he has been working more than normal recently.  He has been having arguments with his significant other and today found out that someone was coming to do a job form could not, which led to another argument.  He thinks the combination of these led to him feeling worse.  Home Medications Prior to Admission medications   Medication Sig Start Date End Date Taking? Authorizing Provider  albuterol (VENTOLIN HFA) 108 (90 Base) MCG/ACT inhaler Inhale 1-2 puffs into the lungs every 6 (six) hours as needed for wheezing or shortness of breath. 12/28/21  Yes Particia Nearing, PA-C  diltiazem (CARDIZEM CD) 360 MG 24 hr capsule Take 1 capsule (360 mg total) by mouth daily. 12/19/21  Yes   hydrochlorothiazide (HYDRODIURIL) 25 MG tablet Take 1 tablet (25 mg total) by mouth daily. 11/28/20  Yes   ibuprofen (ADVIL,MOTRIN) 200 MG tablet Take 400 mg by mouth every 6 (six) hours as needed for headache.   Yes  [provider]  Multiple Vitamin (MULTIVITAMIN WITH MINERALS) TABS tablet Take 1 tablet by mouth daily.   Yes [provider]  promethazine-dextromethorphan (PROMETHAZINE-DM) 6.25-15 MG/5ML syrup Take 5 mLs by mouth 4 (four) times daily as needed. Patient not taking: Reported on 02/16/2022 12/28/21   Particia Nearing, PA-C      Allergies    Patient has no known allergies.    Review of Systems   Review of Systems  Constitutional:  Negative for fever.  Respiratory:  Positive for chest tightness. Negative for shortness of breath.   Cardiovascular:  Negative for chest pain.  Gastrointestinal:  Negative for abdominal pain, nausea and vomiting.  Neurological:  Positive for syncope and headaches.    Physical Exam Updated Vital Signs BP 129/80   Pulse 62   Temp 98.3 F (36.8 C) (Oral)   Resp 15   Ht 6\' 3"  (1.905 m)   Wt 122.5 kg   SpO2 100%   BMI 33.75 kg/m  Physical Exam Vitals and nursing note reviewed.  Constitutional:      Appearance: He is well-developed.  HENT:     Head: Normocephalic.     Comments: Mild tenderness over right parietal scalp. No swelling/deformity Eyes:     Extraocular Movements: Extraocular movements intact.     Pupils: Pupils are equal, round, and reactive to  light.  Cardiovascular:     Rate and Rhythm: Normal rate and regular rhythm.     Heart sounds: Normal heart sounds. No murmur heard. Pulmonary:     Effort: Pulmonary effort is normal.     Breath sounds: Normal breath sounds.  Abdominal:     Palpations: Abdomen is soft.     Tenderness: There is no abdominal tenderness.  Skin:    General: Skin is warm and dry.  Neurological:     Mental Status: He is alert.     Comments: CN 3-12 grossly intact. 5/5 strength in all 4 extremities. Grossly normal sensation. Normal finger to nose.      ED Results / Procedures / Treatments   Labs (all labs ordered are listed, but only abnormal results are displayed) Labs Reviewed   COMPREHENSIVE METABOLIC PANEL - Abnormal; Notable for the following components:      Result Value   Sodium 132 (*)    Glucose, Bld 309 (*)    ALT 46 (*)    All other components within normal limits  CBC WITH DIFFERENTIAL/PLATELET - Abnormal; Notable for the following components:   MCV 76.8 (*)    MCH 25.7 (*)    Platelets 420 (*)    All other components within normal limits  CBG MONITORING, ED - Abnormal; Notable for the following components:   Glucose-Capillary 276 (*)    All other components within normal limits  TROPONIN I (HIGH SENSITIVITY)    EKG EKG Interpretation  Date/Time:  Sunday February 16 2022 12:27:39 EDT Ventricular Rate:  63 PR Interval:  179 QRS Duration: 86 QT Interval:  386 QTC Calculation: 396 R Axis:   66 Text Interpretation: Sinus rhythm no acute ST/T changes No old tracing to compare Confirmed by Pricilla Loveless [id number]) on 02/16/2022 12:39:36 PM  Radiology DG Chest 2 View  Result Date: 02/16/2022 CLINICAL DATA:  Chest pain and syncope. EXAM: CHEST - 2 VIEW COMPARISON:  None Available. FINDINGS: Lungs are adequately inflated without focal airspace consolidation or effusion. Cardiomediastinal silhouette is normal. Minimal degenerative change of the spine. IMPRESSION: No active cardiopulmonary disease. Electronically Signed   By: Elberta Fortis M.D.   On: 02/16/2022 13:05    Procedures Procedures    Medications Ordered in ED Medications  ibuprofen (ADVIL) tablet 800 mg (800 mg Oral Given 02/16/22 1304)    ED Course/ Medical Decision Making/ A&P                           Medical Decision Making Amount and/or Complexity of Data Reviewed Labs: ordered. Radiology: ordered.  Risk Prescription drug management.   ECG without acute ischemia.  No arrhythmia.  Labs including WBC, hemoglobin, renal function are benign.  He does have some hyperglycemia but no acidosis.  Troponin is negative after a few days of continuous chest tightness.  Patient feels like  this is all stress related.  That may be causing some symptoms though the cause of his passing out is unclear.  He reports some palpitations but not until after he woke up.  I think would be reasonable to have him follow-up with cardiology but I still at this point have a relatively low suspicion that he had a significant arrhythmia causing his symptoms.  No thunderclap headache.  From a head injury perspective seems like he has a minor head injury with no red flags including no vomiting, neuro symptoms, etc.  We discussed head CT imaging and decided to  hold off through shared decision making.  Chest x-ray images viewed by myself, no pneumonia or edema.  Doubt PE.  At this point, I do not think he needs further troponin testing or admission and can follow-up with PCP and cardiology as an outpatient.  Given return precautions.        Final Clinical Impression(s) / ED Diagnoses Final diagnoses:  Syncope and collapse  Minor head injury, initial encounter    Rx / DC Orders ED Discharge Orders          Ordered    Ambulatory referral to Cardiology       Comments: If you have not heard from the Cardiology office within the next 72 hours please call [phone].   02/16/22 1413              Pricilla Loveless, MD 02/16/22 1459

## 2022-02-16 NOTE — Discharge Instructions (Addendum)
If you develop new or worsening headache, chest pain, shortness of breath, vomiting, vision changes, numbness or weakness, trouble speaking, or any other new/concerning symptoms then return to the ER for evaluation.  Otherwise, follow-up with cardiology as far as work-up for why you passed out.

## 2022-02-19 ENCOUNTER — Encounter: Payer: Self-pay | Admitting: Cardiovascular Disease

## 2022-02-19 ENCOUNTER — Ambulatory Visit (INDEPENDENT_AMBULATORY_CARE_PROVIDER_SITE_OTHER): Payer: Self-pay | Admitting: Cardiovascular Disease

## 2022-02-19 ENCOUNTER — Encounter: Payer: Self-pay | Admitting: *Deleted

## 2022-02-19 VITALS — BP 128/80 | HR 77 | Ht 75.0 in | Wt 267.2 lb

## 2022-02-19 DIAGNOSIS — I10 Essential (primary) hypertension: Secondary | ICD-10-CM

## 2022-02-19 DIAGNOSIS — R55 Syncope and collapse: Secondary | ICD-10-CM

## 2022-02-19 NOTE — Patient Instructions (Signed)
Medication Instructions:  Stop HCTZ   *If you need a refill on your cardiac medications before your next appointment, please call your pharmacy*   Lab Work: NONE   If you have labs (blood work) drawn today and your tests are completely normal, you will receive your results only by: MyChart Message (if you have MyChart) OR A paper copy in the mail If you have any lab test that is abnormal or we need to change your treatment, we will call you to review the results.   Testing/Procedures: Your physician has requested that you have an exercise tolerance test. For further information please visit [email or web address] Please also follow instruction sheet, as given.  Your physician has requested that you have an echocardiogram. Echocardiography is a painless test that uses sound waves to create images of your heart. It provides your doctor with information about the size and shape of your heart and how well your heart's chambers and valves are working. This procedure takes approximately one hour. There are no restrictions for this procedure.    Follow-Up: At [facility], you and your health needs are our priority.  As part of our continuing mission to provide you with exceptional heart care, we have created designated Provider Care Teams.  These Care Teams include your primary Cardiologist (physician) and Advanced Practice Providers (APPs -  Physician Assistants and Nurse Practitioners) who all work together to provide you with the care you need, when you need it.  We recommend signing up for the patient portal called "MyChart".  Sign up information is provided on this After Visit Summary.  MyChart is used to connect with patients for Virtual Visits (Telemedicine).  Patients are able to view lab/test results, encounter notes, upcoming appointments, etc.  Non-urgent messages can be sent to your provider as well.   To learn more about what you can do with MyChart, go to [email or web address].     Your next appointment:    After Testing   The format for your next appointment:   In Person  Provider:   Charlton Haws, MD    Other Instructions Thank you for choosing [facility]!    Important Information About Sugar

## 2022-02-19 NOTE — Progress Notes (Signed)
CARDIOLOGY CONSULT NOTE       Patient ID: Curtis Rich MRN: [id number] DOB/AGE: Mar 19, 1986 36 y.o.  Admit date: (Not on file) Referring Physician: Doreen Beam EF Primary Physician: Alwyn Pea, MD Primary Cardiologist: New Reason for Consultation: LOC/Syncope   Active Problems:   * No active hospital problems. *   HPI:  36 y.o. referred by DR Criss Alvine [facility] ED for LOC/syncope Seen in ER 02/16/22 Was getting out of car and walked about 10 feet and passed out. Felt weak and diaphoretic Has had some chest tightness for 203 days Hit head on right side He has had some stress and working more as CNA long term care at [employer]   Having arguments with his significant other He has 3 children with 2 different woman ages 101-11   In ER vitals normal not postural No focal neuro signs Did not have head CT CXR NAD Labs ok Troponin negative Takes cardizem and HCTZ for HTN  ECG normal SR rate 63 normal QT  His first episode was in high school during summer workouts Has had 4 episodes over the years most during hot weather. 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 and is on a diuretic   ROS All other systems reviewed and negative except as noted above  Past Medical History:  Diagnosis Date   Diabetes mellitus     Family History  Problem Relation Age of Onset   Hypertension Father    Diabetes Other     Social History   Socioeconomic History   Marital status: Single    Spouse name: Not on file   Number of children: Not on file   Years of education: Not on file   Highest education level: Not on file  Occupational History   Not on file  Tobacco Use   Smoking status: Every Day    Packs/day: 0.50    Years: 9.00    Total pack years: 4.50    Types: Cigarettes   Smokeless tobacco: Never  Substance and Sexual Activity   Alcohol use: No   Drug use: No   Sexual activity: Not on file  Other Topics Concern   Not on file  Social History Narrative   Not on file   Social Determinants of  Health   Financial Resource Strain: Not on file  Food Insecurity: Not on file  Transportation Needs: Not on file  Physical Activity: Not on file  Stress: Not on file  Social Connections: Not on file  Intimate Partner Violence: Not on file    History reviewed. No pertinent surgical history.    Current Outpatient Medications:    albuterol (VENTOLIN HFA) 108 (90 Base) MCG/ACT inhaler, Inhale 1-2 puffs into the lungs every 6 (six) hours as needed for wheezing or shortness of breath., Disp: 18 g, Rfl: 0   diltiazem (CARDIZEM CD) 360 MG 24 hr capsule, Take 1 capsule (360 mg total) by mouth daily., Disp: 90 capsule, Rfl: 1   hydrochlorothiazide (HYDRODIURIL) 25 MG tablet, Take 1 tablet (25 mg total) by mouth daily., Disp: 90 tablet, Rfl: 2   ibuprofen (ADVIL,MOTRIN) 200 MG tablet, Take 400 mg by mouth every 6 (six) hours as needed for headache., Disp: , Rfl:    Multiple Vitamin (MULTIVITAMIN WITH MINERALS) TABS tablet, Take 1 tablet by mouth daily., Disp: , Rfl:     Physical Exam: Blood pressure 128/80, pulse 77, height 6\' 3"  (1.905 m), weight 267 lb 3.2 oz (121.2 kg), SpO2 98 %.    Affect  appropriate Healthy:  appears stated age HEENT: normal Neck supple with no adenopathy JVP normal no bruits no thyromegaly Lungs clear with no wheezing and good diaphragmatic motion Heart:  S1/S2 no murmur, no rub, gallop or click PMI normal Abdomen: benighn, BS positve, no tenderness, no AAA no bruit.  No HSM or HJR Distal pulses intact with no bruits No edema Neuro non-focal Skin warm and dry No muscular weakness   Labs:   Lab Results  Component Value Date   WBC 9.7 02/16/2022   HGB 13.4 02/16/2022   HCT 40.1 02/16/2022   MCV 76.8 (L) 02/16/2022   PLT 420 (H) 02/16/2022    Recent Labs  Lab 02/16/22 1242  NA 132*  K 4.5  CL 100  CO2 24  BUN 11  CREATININE 1.20  CALCIUM 8.9  PROT 7.8  BILITOT 1.0  ALKPHOS 114  ALT 46*  AST 35  GLUCOSE 309*   No results found for:  "CKTOTAL", "CKMB", "CKMBINDEX", "TROPONINI"  Lab Results  Component Value Date   CHOL  06/02/2007    162        ATP III CLASSIFICATION:  <200     mg/dL   Desirable  741-287  mg/dL   Borderline High  >=867    mg/dL   High   Lab Results  Component Value Date   HDL 25 (L) 06/02/2007   Lab Results  Component Value Date   LDLCALC (H) 06/02/2007    121        Total Cholesterol/HDL:CHD Risk Coronary Heart Disease Risk Table                     Men   Women  1/2 Average Risk   3.4   3.3   Lab Results  Component Value Date   TRIG 80 06/02/2007   Lab Results  Component Value Date   CHOLHDL 6.5 06/02/2007   No results found for: "LDLDIRECT"    Radiology: DG Chest 2 View  Result Date: 02/16/2022 CLINICAL DATA:  Chest pain and syncope. EXAM: CHEST - 2 VIEW COMPARISON:  None Available. FINDINGS: Lungs are adequately inflated without focal airspace consolidation or effusion. Cardiomediastinal silhouette is normal. Minimal degenerative change of the spine. IMPRESSION: No active cardiopulmonary disease. Electronically Signed   By: Elberta Fortis M.D.   On: 02/16/2022 13:05    EKG: See HPI normal 02/16/22    ASSESSMENT AND PLAN:   LOC/Syncope:  etiology unclear but not likely cardiac with no antecedent symptoms ECG and exam normal will order TTE to r/o structural heart dx and ETT to make sure he has normal hemodynamic response to exercise stress  Can consider cardiac MRI or chest CTA to r/o sarcoid pending initial testing  HTN:  Had been on cardizem and HCTZ  would talk to primary to d/c HCTZ given sickle trait Can add alternative med if needed   TTE ETT   F/U cardiology after initial testing   Signed: Charlton Haws 02/19/2022, 3:17 PM

## 2022-02-21 ENCOUNTER — Ambulatory Visit (HOSPITAL_COMMUNITY)
Admission: RE | Admit: 2022-02-21 | Discharge: 2022-02-21 | Disposition: A | Discharge status: Home / Self Care | Payer: No Typology Code available for payment source | Source: Ambulatory Visit | Attending: Cardiovascular Disease | Admitting: Cardiovascular Disease

## 2022-02-21 DIAGNOSIS — R55 Syncope and collapse: Secondary | ICD-10-CM

## 2022-02-21 LAB — EXERCISE TOLERANCE TEST
Angina Index: 0
Duke Treadmill Score: 9
Estimated workload: 10.4
Exercise duration (min): 8 min
Exercise duration (sec): 32 s
MPHR: 186 {beats}/min
Peak HR: 155 {beats}/min
Percent HR: 83 %
RPE: 13
Rest HR: 55 {beats}/min
ST Depression (mm): 0 mm

## 2022-02-24 ENCOUNTER — Ambulatory Visit (INDEPENDENT_AMBULATORY_CARE_PROVIDER_SITE_OTHER): Payer: Self-pay

## 2022-02-24 DIAGNOSIS — R55 Syncope and collapse: Secondary | ICD-10-CM

## 2022-02-24 LAB — ECHOCARDIOGRAM COMPLETE
AR max vel: 2.6 cm2
AV Area VTI: 2.64 cm2
AV Area mean vel: 2.54 cm2
AV Mean grad: 3.8 mmHg
AV Peak grad: 8.1 mmHg
Ao pk vel: 1.42 m/s
Area-P 1/2: 4.29 cm2
Calc EF: 68.2 %
MV M vel: 2.45 m/s
MV Peak grad: 23.9 mmHg
S' Lateral: 2.52 cm
Single Plane A2C EF: 74.8 %
Single Plane A4C EF: 65.7 %

## 2022-11-03 ENCOUNTER — Ambulatory Visit
Admission: EM | Admit: 2022-11-03 | Discharge: 2022-11-03 | Disposition: A | Discharge status: Home / Self Care | Payer: Self-pay | Attending: Physician Assistant | Admitting: Physician Assistant

## 2022-11-03 DIAGNOSIS — H6001 Abscess of right external ear: Secondary | ICD-10-CM

## 2022-11-03 MED ORDER — SULFAMETHOXAZOLE-TRIMETHOPRIM 800-160 MG PO TABS: 1.0000 | ORAL_TABLET | Freq: Two times a day (BID) | ORAL | 0 refills | Status: AC

## 2022-11-03 NOTE — ED Provider Notes (Signed)
[facility]    CSN: [id number] Arrival date & time: 11/03/22  1721      History   Chief Complaint No chief complaint on file.   HPI Curtis Rich is a 37 y.o. male.   Patient presents today with a 2-day history of enlarging lesion on base of right earlobe lobule.  He reports that pain is rated 9 on a 0-10 pain scale, described as throbbing, no aggravating alleviating factors identified.  He has tried Tylenol and ibuprofen without improvement of symptoms.  He denies history of recurrent skin infections including MRSA.  Denies any recent antibiotics.  Denies any associated fever, nausea, vomiting, weakness.  He does have a history of diabetes but reports that this is now diet controlled after he is lost a significant amount of weight and his blood sugars are appropriate.    Past Medical History:  Diagnosis Date   Diabetes mellitus     There are no problems to display for this patient.   History reviewed. No pertinent surgical history.     Home Medications    Prior to Admission medications   Medication Sig Start Date End Date Taking? Authorizing Provider  sulfamethoxazole-trimethoprim (BACTRIM DS) 800-160 MG tablet Take 1 tablet by mouth 2 (two) times daily for 7 days. 11/03/22 11/10/22 Yes Aronda Burford, Noberto Retort, PA-C  diltiazem (CARDIZEM CD) 360 MG 24 hr capsule Take 1 capsule (360 mg total) by mouth daily. 12/19/21     ibuprofen (ADVIL,MOTRIN) 200 MG tablet Take 400 mg by mouth every 6 (six) hours as needed for headache.    [provider]  Multiple Vitamin (MULTIVITAMIN WITH MINERALS) TABS tablet Take 1 tablet by mouth daily.    [provider]    Family History Family History  Problem Relation Age of Onset   Hypertension Father    Diabetes Other     Social History Social History   Tobacco Use   Smoking status: Every Day    Packs/day: 0.50    Years: 9.00    Additional pack years: 0.00    Total pack years: 4.50    Types: Cigarettes    Smokeless tobacco: Never  Substance Use Topics   Alcohol use: No   Drug use: No     Allergies   Patient has no known allergies.   Review of Systems Review of Systems  Constitutional:  Positive for activity change. Negative for appetite change, fatigue and fever.  Gastrointestinal:  Negative for abdominal pain, diarrhea, nausea and vomiting.  Musculoskeletal:  Negative for arthralgias and myalgias.  Skin:  Negative for color change and wound.  Neurological:  Negative for weakness and numbness.     Physical Exam Triage Vital Signs ED Triage Vitals  Enc Vitals Group     BP 11/03/22 1757 (!) 168/105     Pulse Rate 11/03/22 1757 (!) 112     Resp --      Temp 11/03/22 1757 98.3 F (36.8 C)     Temp Source 11/03/22 1757 Oral     SpO2 11/03/22 1757 97 %     Weight --      Height --      Head Circumference --      Peak Flow --      Pain Score 11/03/22 1801 9     Pain Loc --      Pain Edu? --      Excl. in GC? --    No data found.  Updated Vital Signs BP Marland Kitchen)  142/102 (BP Location: Right Arm)   Pulse 95   Temp 98.3 F (36.8 C) (Oral)   SpO2 97%   Visual Acuity Right Eye Distance:   Left Eye Distance:   Bilateral Distance:    Right Eye Near:   Left Eye Near:    Bilateral Near:     Physical Exam Vitals reviewed.  Constitutional:      General: He is awake.     Appearance: Normal appearance. He is well-developed. He is not ill-appearing.     Comments: Very pleasant male appears stated age in no acute distress sitting comfortably in exam room  HENT:     Head: Normocephalic and atraumatic.     Mouth/Throat:     Pharynx: No oropharyngeal exudate, posterior oropharyngeal erythema or uvula swelling.  Cardiovascular:     Rate and Rhythm: Normal rate and regular rhythm.     Heart sounds: Normal heart sounds, S1 normal and S2 normal. No murmur heard. Pulmonary:     Effort: Pulmonary effort is normal.     Breath sounds: Normal breath sounds. No stridor. No wheezing,  rhonchi or rales.     Comments: Clear to auscultation bilaterally Skin:    Findings: Abscess present.     Comments: 2 cm x 1 cm nodule with small amount of central fluctuance noted posterior lobule of right ear.  No active bleeding or drainage noted.  No streaking or evidence of lymphangitis.  Neurological:     Mental Status: He is alert.  Psychiatric:        Behavior: Behavior is cooperative.      UC Treatments / Results  Labs (all labs ordered are listed, but only abnormal results are displayed) Labs Reviewed - No data to display  EKG   Radiology No results found.  Procedures Procedures (including critical care time)  Medications Ordered in UC Medications - No data to display  Initial Impression / Assessment and Plan / UC Course  I have reviewed the triage vital signs and the nursing notes.  Pertinent labs & imaging results that were available during my care of the patient were reviewed by me and considered in my medical decision making (see chart for details).     Patient was initially tachycardic and hypertensive but this improved following procedure.  He is otherwise well-appearing, afebrile, nontoxic.  Small abscess was noted on exam.  We discussed potential utility of I&D procedure, however, patient was hesitant to undergo this as we discussed potential complications including incomplete drainage/pain.  He was agreeable to puncture aspiration and so area was cleaned with alcohol and chlorhexidine and then 18-gauge needle was introduced after skin was sufficiently anesthetized with freeze spray with approximately 0.5 cc of purulent drainage removed.  Adequate hemostasis was achieved with direct pressure following procedure.  Patient tolerated this well and reported significant improvement of symptoms.  Will start him on Bactrim DS twice daily he was instructed to stop the medication to be seen immediately with any oral lesions or rash.  Recommend he keep the area clean.  If  he has any worsening or changing symptoms including enlarging lesion, increasing pain, fever, nausea, vomiting he needs to be seen immediately.  Strict return precautions given.  Work excuse note provided.  Final Clinical Impressions(s) / UC Diagnoses   Final diagnoses:  Abscess of right earlobe     Discharge Instructions      Keep this area clean with soap and water.  Take Bactrim DS twice daily for 7 days.  If develop any rash or oral lesions please stop the medication to be seen immediately.  If this lesion enlarges or you have any worsening symptoms including fever, nausea, vomiting you should be seen immediately.     ED Prescriptions     Medication Sig Dispense Auth. Provider   sulfamethoxazole-trimethoprim (BACTRIM DS) 800-160 MG tablet Take 1 tablet by mouth 2 (two) times daily for 7 days. 14 tablet Kameela Leipold, Noberto Retort, PA-C      PDMP not reviewed this encounter.   Jeani Hawking, PA-C 11/03/22 1900

## 2022-11-03 NOTE — ED Triage Notes (Signed)
Pt c/o knot behind ear right ear, movement hurts, onset 2 days ago. Denies injury to ear. Pt states it feels almost like a pimple warm to the touch.

## 2022-11-03 NOTE — Discharge Instructions (Addendum)
Keep this area clean with soap and water.  Take Bactrim DS twice daily for 7 days.  If develop any rash or oral lesions please stop the medication to be seen immediately.  If this lesion enlarges or you have any worsening symptoms including fever, nausea, vomiting you should be seen immediately.

## 2022-11-13 ENCOUNTER — Ambulatory Visit
Admission: EM | Admit: 2022-11-13 | Discharge: 2022-11-13 | Disposition: A | Discharge status: Home / Self Care | Payer: Self-pay | Attending: Internal Medicine | Admitting: Internal Medicine

## 2022-11-13 ENCOUNTER — Telehealth: Payer: Self-pay

## 2022-11-13 ENCOUNTER — Encounter (HOSPITAL_COMMUNITY): Payer: Self-pay

## 2022-11-13 ENCOUNTER — Other Ambulatory Visit: Payer: Self-pay

## 2022-11-13 ENCOUNTER — Emergency Department (HOSPITAL_COMMUNITY)
Admission: EM | Admit: 2022-11-13 | Discharge: 2022-11-13 | Disposition: A | Discharge status: Home / Self Care | Payer: Self-pay | Attending: Emergency Medicine | Admitting: Emergency Medicine

## 2022-11-13 DIAGNOSIS — R739 Hyperglycemia, unspecified: Secondary | ICD-10-CM

## 2022-11-13 DIAGNOSIS — E1165 Type 2 diabetes mellitus with hyperglycemia: Secondary | ICD-10-CM

## 2022-11-13 DIAGNOSIS — E119 Type 2 diabetes mellitus without complications: Secondary | ICD-10-CM

## 2022-11-13 LAB — BASIC METABOLIC PANEL
Anion gap: 10 (ref 5–15)
BUN: 17 mg/dL (ref 6–20)
CO2: 23 mmol/L (ref 22–32)
Calcium: 8.4 mg/dL — ABNORMAL LOW (ref 8.9–10.3)
Chloride: 87 mmol/L — ABNORMAL LOW (ref 98–111)
Creatinine, Ser: 1.44 mg/dL — ABNORMAL HIGH (ref 0.61–1.24)
GFR, Estimated: >60 mL/min (ref >60)
Glucose, Bld: 687 mg/dL (ref 70–99)
Potassium: 4.2 mmol/L (ref 3.5–5.1)
Sodium: 120 mmol/L — ABNORMAL LOW (ref 135–145)

## 2022-11-13 LAB — URINALYSIS, ROUTINE W REFLEX MICROSCOPIC
Bacteria, UA: NONE SEEN
Bilirubin Urine: NEGATIVE
Glucose, UA: >=500 mg/dL — AB
Hgb urine dipstick: NEGATIVE
Ketones, ur: NEGATIVE mg/dL
Leukocytes,Ua: NEGATIVE
Nitrite: NEGATIVE
Protein, ur: NEGATIVE mg/dL
Specific Gravity, Urine: 1.024 (ref 1.005–1.030)
pH: 6 (ref 5.0–8.0)

## 2022-11-13 LAB — BLOOD GAS, VENOUS
Acid-Base Excess: 2.5 mmol/L — ABNORMAL HIGH (ref 0.0–2.0)
Bicarbonate: 27.9 mmol/L (ref 20.0–28.0)
Drawn by: 1854
O2 Saturation: 93.7 %
Patient temperature: 36.7
pCO2, Ven: 44 mmHg (ref 44–60)
pH, Ven: 7.4 (ref 7.25–7.43)
pO2, Ven: 58 mmHg — ABNORMAL HIGH (ref 32–45)

## 2022-11-13 LAB — CBC
HCT: 42 % (ref 39.0–52.0)
Hemoglobin: 14.4 g/dL (ref 13.0–17.0)
MCH: 25.7 pg — ABNORMAL LOW (ref 26.0–34.0)
MCHC: 34.3 g/dL (ref 30.0–36.0)
MCV: 74.9 fL — ABNORMAL LOW (ref 80.0–100.0)
Platelets: 366 10*3/uL (ref 150–400)
RBC: 5.61 MIL/uL (ref 4.22–5.81)
RDW: 13 % (ref 11.5–15.5)
WBC: 7.8 10*3/uL (ref 4.0–10.5)
nRBC: 0 % (ref 0.0–0.2)

## 2022-11-13 LAB — BETA-HYDROXYBUTYRIC ACID: Beta-Hydroxybutyric Acid: 0.09 mmol/L (ref 0.05–0.27)

## 2022-11-13 LAB — POCT FASTING CBG KUC MANUAL ENTRY: POCT Glucose (KUC): 600 mg/dL — AB (ref 70–99)

## 2022-11-13 LAB — CBG MONITORING, ED
Glucose-Capillary: >600 mg/dL (ref 70–99)
Glucose-Capillary: 414 mg/dL — ABNORMAL HIGH (ref 70–99)
Glucose-Capillary: 509 mg/dL (ref 70–99)
Glucose-Capillary: 301 mg/dL — ABNORMAL HIGH (ref 70–99)

## 2022-11-13 MED ORDER — DEXTROSE 50 % IV SOLN
0.0000 mL | INTRAVENOUS | Status: DC | PRN
  Filled 2022-11-13: qty 50

## 2022-11-13 MED ORDER — DEXTROSE IN LACTATED RINGERS 5 % IV SOLN: INTRAVENOUS | Status: DC

## 2022-11-13 MED ORDER — INSULIN PEN NEEDLE 32G X 4 MM MISC: 1.0000 | Freq: Two times a day (BID) | 1 refills | Status: AC

## 2022-11-13 MED ORDER — LACTATED RINGERS IV SOLN: INTRAVENOUS | Status: DC

## 2022-11-13 MED ORDER — INSULIN REGULAR(HUMAN) IN NACL 100-0.9 UT/100ML-% IV SOLN
INTRAVENOUS | Status: DC
  Administered 2022-11-13: 15 [IU]/h via INTRAVENOUS
  Filled 2022-11-13: qty 100

## 2022-11-13 MED ORDER — NOVOLIN 70/30 FLEXPEN RELION (70-30) 100 UNIT/ML ~~LOC~~ SUPN: 20.0000 [IU] | PEN_INJECTOR | Freq: Two times a day (BID) | SUBCUTANEOUS | 11 refills | Status: AC

## 2022-11-13 MED ORDER — LACTATED RINGERS IV BOLUS
2500.0000 mL | Freq: Once | INTRAVENOUS | Status: AC
  Administered 2022-11-13: 2500 mL via INTRAVENOUS

## 2022-11-13 NOTE — Telephone Encounter (Signed)
This RNCM received call from [facility] ED SW re: need for medication assistance. Patient had been discharged prior to this RNCM being notified. This RNCM called patient regarding TOC consult for needing assistance with PCP and medication. Left voicemail for call back.   [facility] SW advised bedside RN secured chatted her indicating patient reports he was able to afford his medications that was reviewed by diabetic coordinator.

## 2022-11-13 NOTE — ED Provider Notes (Signed)
[place] EMERGENCY DEPARTMENT AT [facility] Provider Note   CSN: [id number] Arrival date & time: 11/13/22  1242     History  Chief Complaint  Patient presents with   Hyperglycemia     Curtis Rich is a 37 y.o. male who presents emergency department with chief complaint of hyperglycemia.  He has a past medical history of insulin-dependent type 2 diabetes since the age of 64.  He reports that he was first diagnosed when his blood sugar was "1400" and had to be hospitalized for it.  He denies history of DKA.  Patient lost his insurance coverage 90 days ago.  He has not had any medications.  Has been intermittently checking of blood sugar at work 1-2 times a week states that his "not been that high."  He developed an abscess behind his ear a couple weeks ago and took antibiotics states that things seem to get worse after that.  He has developed polyphasia, polyuria, polydipsia, blurry vision.  He was seen in urgent care today and sent in for blood sugar greater than 600.   Hyperglycemia      Home Medications Prior to Admission medications   Medication Sig Start Date End Date Taking? Authorizing Provider  diltiazem (CARDIZEM CD) 360 MG 24 hr capsule Take 1 capsule (360 mg total) by mouth daily. 12/19/21  Yes   ibuprofen (ADVIL,MOTRIN) 200 MG tablet Take 400 mg by mouth every 6 (six) hours as needed for headache.   Yes [provider]  Multiple Vitamin (MULTIVITAMIN WITH MINERALS) TABS tablet Take 1 tablet by mouth daily.   Yes [provider]      Allergies    Patient has no known allergies.    Review of Systems   Review of Systems  Physical Exam Updated Vital Signs BP 119/79   Pulse 93   Temp 98.1 F (36.7 C) (Oral)   Resp 16   Ht  (1.905 m)   Wt 117.9 kg   SpO2 97%   BMI 32.50 kg/m  Physical Exam Physical Exam  Nursing note and vitals reviewed. Constitutional: He appears well-developed and well-nourished. No distress.  HENT:  Head:  Normocephalic and atraumatic.  Eyes: Conjunctivae normal are normal. No scleral icterus.  Neck: Normal range of motion. Neck supple.  Cardiovascular: Normal rate, regular rhythm and normal heart sounds.   Pulmonary/Chest: Effort normal and breath sounds normal. No respiratory distress.  No tachypnea Abdominal: Soft. There is no tenderness.  Musculoskeletal: He exhibits no edema.  Neurological: He is alert.  Skin: Skin is warm and dry. He is not diaphoretic.  Psychiatric: His behavior is normal.   ED Results / Procedures / Treatments   Labs (all labs ordered are listed, but only abnormal results are displayed) Labs Reviewed  CBC - Abnormal; Notable for the following components:      Result Value   MCV 74.9 (*)    MCH 25.7 (*)    All other components within normal limits  URINALYSIS, ROUTINE W REFLEX MICROSCOPIC - Abnormal; Notable for the following components:   Color, Urine COLORLESS (*)    Glucose, UA >=500 (*)    All other components within normal limits  BLOOD GAS, VENOUS - Abnormal; Notable for the following components:   pO2, Ven 58 (*)    Acid-Base Excess 2.5 (*)    All other components within normal limits  CBG MONITORING, ED - Abnormal; Notable for the following components:   Glucose-Capillary >600 (*)  All other components within normal limits  BASIC METABOLIC PANEL  BETA-HYDROXYBUTYRIC ACID  BETA-HYDROXYBUTYRIC ACID  CBG MONITORING, ED    EKG None  Radiology No results found.  Procedures .Critical Care  Performed by: Arthor Captain, PA-C Authorized by: Arthor Captain, PA-C   Critical care provider statement:    Critical care time (minutes):  30   Critical care time was exclusive of:  Separately billable procedures and treating other patients   Critical care was necessary to treat or prevent imminent or life-threatening deterioration of the following conditions:  Metabolic crisis   Critical care was time spent personally by me on the following  activities:  Development of treatment plan with patient or surrogate, discussions with consultants, evaluation of patient's response to treatment, examination of patient, ordering and review of laboratory studies, ordering and review of radiographic studies, ordering and performing treatments and interventions, pulse oximetry, re-evaluation of patient's condition, review of old charts and obtaining history from patient or surrogate .Critical Care  Performed by: Arthor Captain, PA-C Authorized by: Arthor Captain, PA-C   Critical care provider statement:    Critical care time (minutes):  60   Critical care time was exclusive of:  Teaching time and separately billable procedures and treating other patients   Critical care was necessary to treat or prevent imminent or life-threatening deterioration of the following conditions:  Metabolic crisis   Critical care was time spent personally by me on the following activities:  Development of treatment plan with patient or surrogate, discussions with consultants, evaluation of patient's response to treatment, examination of patient, ordering and review of laboratory studies, ordering and review of radiographic studies, ordering and performing treatments and interventions, pulse oximetry, re-evaluation of patient's condition and review of old charts     Medications Ordered in ED Medications  lactated ringers bolus 2,500 mL (2,500 mLs Intravenous Bolus 11/13/22 1336)    ED Course/ Medical Decision Making/ A&P Clinical Course as of 11/13/22 1654  Thu Nov 13, 2022  1401 pH, Ven: 7.4 [AH]  1401 Urinalysis, Routine w reflex microscopic -Urine, Clean Catch(!) [AH]  1402 Glucose-Capillary(!!): >600 [AH]  1442 Potassium: 4.2 [AH]  1549 Glucose-Capillary(!): 414 [AH]    Clinical Course User Index [AH] Arthor Captain, PA-C                             Medical Decision Making This is a 37 year old gentleman with a past medical history of type 2  insulin-dependent diabetes mellitus.  He due to lots of his insurance he has not been taking any medications for months and presents today in hyperglycemic crisis.  He otherwise has no signs or symptoms of underlying infection, DKA, HHS.  I ordered labs which showed significantly elevated blood glucose, no signs of urinary tract infection, ABG without evidence of acidosis, s corrected serum sodium within normal limits, creatinine appears to be a little bit bumped but not significant from previous.   Patient placed on insulin drip and his blood sugars down to 300.  The inpatient diabetes coordinator has consulted with the patient.  He is able to afford a glucometer over-the-counter and I have written for Novolin 70/30.  He is to take 20 units twice daily.  He has a follow-up appointment with his primary care physician this coming month.  He is due to receive his new insurance in the next 10 days.  Discussed outpatient follow-up, and return precautions.  Amount and/or Complexity of Data Reviewed  Labs: ordered. Decision-making details documented in ED Course.  Risk OTC drugs. Prescription drug management.           Final Clinical Impression(s) / ED Diagnoses Final diagnoses:  None    Rx / DC Orders ED Discharge Orders     None         Arthor Captain, PA-C 11/17/22 1157    Linwood Dibbles, MD 11/19/22 1513

## 2022-11-13 NOTE — Inpatient Diabetes Management (Signed)
Inpatient Diabetes Program Recommendations  AACE/ADA: New Consensus Statement on Inpatient Glycemic Control (2015)  Target Ranges:  Prepandial:   less than 140 mg/dL      Peak postprandial:   less than 180 mg/dL (1-2 hours)      Critically ill patients:  140 - 180 mg/dL   Lab Results  Component Value Date   GLUCAP >600 (HH) 11/13/2022    Review of Glycemic Control  Latest Reference Range & Units 11/13/22 12:50  Glucose-Capillary 70 - 99 mg/dL >098 (HH)  (HH): Data is critically high Diabetes history: Type 2 DM Outpatient Diabetes medications: none Has been on Tresiba 30 units QD (NT-cost) Current orders for Inpatient glycemic control: none  Inpatient Diabetes Program Recommendations:    Spoke with patient regarding outpatient diabetes management. Patient lost insurance and was no longer able to afford Tresiba 30 units QD. Last dose was from 02/2022. Patient states he has been trying to monitor with diet and exercise.  Explained what a A1c is and what it measures. Also reviewed goal A1c with patient, importance of good glucose control @ home, and blood sugar goals. Reviewed patho of DM, need for insulin, role of pancreas, signs and symptoms of hypo vs hyper glycemia, interventions, Novolin 70/30- how to obtain/take/time, vascular changes and commorbidities. Patient no longer able to afford test strips. Discussed Relion products and associated costs. Patient able to afford. Reviewed frequency of when to check CBGs with patient and when to follow up with PCP. Will place consult for TOC.  Admits to drinking sugary beverages. Reviewed alternatives and importance of being mindful of CHO intake.   Unsure of ED plan at this time, however, at discharge: Novolin 70/30 20 units BID   Will follow while inpatient.   Thanks, Lujean Rave, MSN, RNC-OB Diabetes Coordinator [phone] (8a-5p)

## 2022-11-13 NOTE — Telephone Encounter (Signed)
Received inbound call from patient. HE reports he can afford the $45 copay for medications and he has a follow up appointment scheduled for Tuesday.   No additional TOC needs

## 2022-11-13 NOTE — ED Provider Notes (Addendum)
[facility]    CSN: [id number] Arrival date & time: 11/13/22  1115      History   Chief Complaint Chief Complaint  Patient presents with   Diabetes    HPI Curtis Rich is a 37 y.o. male.   The history is provided by the patient.   The patient presents for "blood sugar issues" that been present over the last several days.  Patient states that he does have a history of diabetes.  He reports that he was seen in this clinic on 11/03/2022, prescribed an antibiotic, and since that time, he has felt that his blood sugars were elevated.  Today, he complains of blurred vision, polydipsia, polyuria, and polyphasia.  Patient states that he sees his primary care regularly.  He states that his PCP put him on a "shot" that was not approved by his insurance company.  He states he has not been on any medication for his diabetes for more than 6 months.  Patient denies chest pain, shortness of breath, difficulty breathing, abdominal pain, nausea, vomiting, or diarrhea.  Past Medical History:  Diagnosis Date   Diabetes mellitus     There are no problems to display for this patient.   History reviewed. No pertinent surgical history.     Home Medications    Prior to Admission medications   Medication Sig Start Date End Date Taking? Authorizing Provider  diltiazem (CARDIZEM CD) 360 MG 24 hr capsule Take 1 capsule (360 mg total) by mouth daily. 12/19/21     ibuprofen (ADVIL,MOTRIN) 200 MG tablet Take 400 mg by mouth every 6 (six) hours as needed for headache.    [provider]  insulin isophane & regular human KwikPen (NOVOLIN 70/30 KWIKPEN) (70-30) 100 UNIT/ML KwikPen Inject 20 Units into the skin in the morning and at bedtime. 11/13/22   Arthor Captain, PA-C  Insulin Pen Needle 32G X 4 MM MISC 1 Syringe by Does not apply route in the morning and at bedtime. 11/13/22   Arthor Captain, PA-C  Multiple Vitamin (MULTIVITAMIN WITH MINERALS) TABS tablet Take 1 tablet by mouth daily.     [provider]    Family History Family History  Problem Relation Age of Onset   Hypertension Father    Diabetes Other     Social History Social History   Tobacco Use   Smoking status: Every Day    Packs/day: 0.50    Years: 9.00    Additional pack years: 0.00    Total pack years: 4.50    Types: Cigarettes   Smokeless tobacco: Never  Substance Use Topics   Alcohol use: No   Drug use: No     Allergies   Patient has no known allergies.   Review of Systems Review of Systems Per HPI  Physical Exam Triage Vital Signs ED Triage Vitals  Enc Vitals Group     BP 11/13/22 1225 (!) 155/105     Pulse Rate 11/13/22 1225 (!) 108     Resp 11/13/22 1225 20     Temp 11/13/22 1225 98.1 F (36.7 C)     Temp Source 11/13/22 1222 Oral     SpO2 --      Weight --      Height --      Head Circumference --      Peak Flow --      Pain Score 11/13/22 1226 0     Pain Loc --      Pain Edu? --  Excl. in GC? --    No data found.  Updated Vital Signs BP (!) 145/91   Pulse (!) 108   Temp 98.1 F (36.7 C) (Oral)   Resp 20   Visual Acuity Right Eye Distance:   Left Eye Distance:   Bilateral Distance:    Right Eye Near:   Left Eye Near:    Bilateral Near:     Physical Exam Vitals and nursing note reviewed.  Constitutional:      General: He is not in acute distress.    Appearance: Normal appearance.  Eyes:     Extraocular Movements: Extraocular movements intact.     Pupils: Pupils are equal, round, and reactive to light.  Cardiovascular:     Rate and Rhythm: Regular rhythm. Tachycardia present.     Pulses: Normal pulses.     Heart sounds: Normal heart sounds.  Pulmonary:     Effort: Pulmonary effort is normal. No respiratory distress.     Breath sounds: Normal breath sounds. No stridor. No wheezing, rhonchi or rales.  Abdominal:     General: Bowel sounds are normal.     Palpations: Abdomen is soft.     Tenderness: There is no abdominal tenderness.   Musculoskeletal:     Cervical back: Normal range of motion.  Lymphadenopathy:     Cervical: No cervical adenopathy.  Skin:    General: Skin is warm and dry.  Neurological:     General: No focal deficit present.     Mental Status: He is alert and oriented to person, place, and time.  Psychiatric:        Mood and Affect: Mood normal.        Behavior: Behavior normal.      UC Treatments / Results  Labs (all labs ordered are listed, but only abnormal results are displayed) Labs Reviewed  POCT FASTING CBG [facility] MANUAL ENTRY - Abnormal; Notable for the following components:      Result Value   POCT Glucose ([facility]) 600 (*)    All other components within normal limits    EKG   Radiology No results found.  Procedures Procedures (including critical care time)  Medications Ordered in UC Medications - No data to display  Initial Impression / Assessment and Plan / UC Course  I have reviewed the triage vital signs and the nursing notes.  Pertinent labs & imaging results that were available during my care of the patient were reviewed by me and considered in my medical decision making (see chart for details).  Patient presents for blood glucose problems.  Point-of-care glucose test shows patient's blood sugar was greater than 600.  Patient is also having symptoms of polydipsia, polyuria, and polyphasia, along with blurred vision.  There is concern for possible DKA as he is slightly tachycardic.  Due to the patient's presentation, patient was sent to the emergency department for further evaluation via private vehicle.  Patient's vital signs are stable, he declines EMS transport at this time.  Patient advised to go to [facility] emergency department.  Patient is in agreement with this plan of care and verbalizes understanding.  Patient is ambulatory at discharge.   Final Clinical Impressions(s) / UC Diagnoses   Final diagnoses:  Hyperglycemia  Hyperglycemia due to diabetes mellitus      Discharge Instructions      Go to the emergency department for further evaluation.     ED Prescriptions   None    PDMP not reviewed this encounter.  Abran Cantor, NP 11/13/22 1253    Abran Cantor, NP 11/13/22 1959

## 2022-11-13 NOTE — Discharge Instructions (Addendum)
Purchase the rely on blood glucose test kit which has lancets and testing strips as well as a blood glucose meter.  It is available at [facility] for about $20  Contact a health care provider if: Your blood glucose is at or above 240 mg/dL (45.4 mmol/L) for 2 days in a row. You have problems keeping your blood glucose in your target range. You have frequent episodes of hyperglycemia. You have signs of illness, such as nausea, vomiting, or fever. Get help right away if: Your blood glucose monitor reads "high" even when you are taking insulin. You have trouble breathing. You have a change in how you think, feel, or act (mental status). You have nausea or vomiting that does not go away. These symptoms may represent a serious problem that is an emergency. Do not wait to see if the symptoms will go away. Get medical help right away. Call your local emergency services (911 in the [place] Do not drive yourself to the hospital.

## 2022-11-13 NOTE — ED Notes (Signed)
Patient is being discharged from the Urgent Care and sent to the Emergency Department via private vehicle . Per Lorene Dy, APP, patient is in need of higher level of care due to CBG reading in clinic >600, hypertensive with blurred vision. Patient is aware and verbalizes understanding of plan of care.  Vitals:   11/13/22 1225  BP: (!) 155/105  Pulse: (!) 108  Resp: 20  Temp: 98.1 F (36.7 C)

## 2022-11-13 NOTE — Discharge Instructions (Signed)
Go to the emergency department for further evaluation

## 2022-11-13 NOTE — ED Notes (Signed)
This RN aware of patient's sugar, Megan RT informed Lorene Dy APP and she is at bedside at this time

## 2022-11-13 NOTE — ED Notes (Signed)
Pt refused a gown at this time.

## 2022-11-13 NOTE — ED Triage Notes (Signed)
Pt reports his Blood glucose readings have be elevated from last night until this morning. Readings in the 400s. Pt states he got taken off of insulin and put on a once a week injection. States he has been off of that for about 6-7 months. He says he is and has been experiencing blurry visual and urine frequency.

## 2022-11-13 NOTE — ED Notes (Signed)
Insulin gtt stopped per PA. Pt finishing fluid boluses.

## 2022-11-13 NOTE — ED Triage Notes (Signed)
Patient from home for high blood sugar. Reports blood sugar was reading 480 at home; read "high" at urgent care. Reports he has not been on any medication for his blood sugar in a few months. Upon arrival to ER, patient is alert and oriented, ambu

## 2022-11-28 ENCOUNTER — Other Ambulatory Visit (HOSPITAL_COMMUNITY): Payer: Self-pay

## 2022-11-28 MED ORDER — TRESIBA FLEXTOUCH 100 UNIT/ML ~~LOC~~ SOPN
30.0000 [IU] | PEN_INJECTOR | Freq: Every day | SUBCUTANEOUS | 2 refills | Status: DC
  Filled 2022-11-28: qty 3, 10d supply, fill #0
  Filled 2023-02-23: qty 3, 10d supply, fill #1

## 2022-12-04 ENCOUNTER — Other Ambulatory Visit (HOSPITAL_COMMUNITY): Payer: Self-pay

## 2022-12-04 MED ORDER — TRESIBA FLEXTOUCH 100 UNIT/ML ~~LOC~~ SOPN
30.0000 [IU] | PEN_INJECTOR | Freq: Every day | SUBCUTANEOUS | 2 refills | Status: AC
  Filled 2022-12-04 – 2023-02-23 (×2): qty 9, 30d supply, fill #0

## 2022-12-09 ENCOUNTER — Other Ambulatory Visit (HOSPITAL_COMMUNITY): Payer: Self-pay

## 2022-12-18 ENCOUNTER — Other Ambulatory Visit (HOSPITAL_COMMUNITY): Payer: Self-pay

## 2022-12-18 MED ORDER — DILTIAZEM HCL ER COATED BEADS 360 MG PO CP24
360.0000 mg | ORAL_CAPSULE | Freq: Every day | ORAL | 1 refills | Status: AC
  Filled 2022-12-18: qty 30, 30d supply, fill #0
  Filled 2023-02-23: qty 30, 30d supply, fill #1
  Filled 2023-06-02: qty 30, 30d supply, fill #2
  Filled 2023-08-26: qty 30, 30d supply, fill #3
  Filled 2023-11-20: qty 30, 30d supply, fill #4

## 2022-12-19 ENCOUNTER — Other Ambulatory Visit (HOSPITAL_COMMUNITY): Payer: Self-pay

## 2023-02-23 ENCOUNTER — Other Ambulatory Visit: Payer: Self-pay

## 2023-02-23 ENCOUNTER — Other Ambulatory Visit (HOSPITAL_COMMUNITY): Payer: Self-pay

## 2023-06-02 ENCOUNTER — Other Ambulatory Visit (HOSPITAL_COMMUNITY): Payer: Self-pay

## 2023-08-26 ENCOUNTER — Other Ambulatory Visit (HOSPITAL_COMMUNITY): Payer: Self-pay

## 2023-11-20 ENCOUNTER — Other Ambulatory Visit (HOSPITAL_COMMUNITY): Payer: Self-pay

## 2024-05-03 ENCOUNTER — Encounter: Payer: Self-pay | Admitting: Emergency Medicine

## 2024-05-03 ENCOUNTER — Ambulatory Visit
Admission: EM | Admit: 2024-05-03 | Discharge: 2024-05-03 | Disposition: A | Discharge status: Home / Self Care | Payer: Self-pay

## 2024-05-03 DIAGNOSIS — L739 Follicular disorder, unspecified: Secondary | ICD-10-CM

## 2024-05-03 HISTORY — DX: Essential (primary) hypertension: I10

## 2024-05-03 MED ORDER — DOXYCYCLINE HYCLATE 100 MG PO TABS: 100.0000 mg | ORAL_TABLET | Freq: Two times a day (BID) | ORAL | 0 refills | Status: AC

## 2024-05-03 NOTE — ED Provider Notes (Signed)
 [facility]    CSN: [id number] Arrival date & time: 05/03/24  9177      History   Chief Complaint Chief Complaint  Patient presents with   bump on scalp    HPI Curtis Rich is a 38 y.o. male.   The history is provided by the patient.   Patient presents for complaints of a bump on the scalp that appeared 2 days ago.  He states that the areas look like pimples.  He states they popped and began to drain last night.  He states that the areas continue to remain tender.  States that the areas are tender when they touch his pillow.  Patient denies fever, chills, injury, trauma, abdominal pain, nausea, vomiting, or diarrhea.  So far, he has not used any medications for his symptoms.  Past Medical History:  Diagnosis Date   Diabetes mellitus    Hypertension     There are no active problems to display for this patient.   History reviewed. No pertinent surgical history.     Home Medications    Prior to Admission medications   Medication Sig Start Date End Date Taking? Authorizing Provider  doxycycline (VIBRA-TABS) 100 MG tablet Take 1 tablet (100 mg total) by mouth 2 (two) times daily for 5 days. 05/03/24 05/08/24 Yes Leath-Warren, Etta PARAS, NP  lisinopril (ZESTRIL) 20 MG tablet Take 20 mg by mouth daily.   Yes [provider]  spironolactone (ALDACTONE) 25 MG tablet Take 25 mg by mouth daily.   Yes [provider]  diltiazem  (CARDIZEM  CD) 360 MG 24 hr capsule Take 1 capsule (360 mg total) by mouth daily. 12/19/21     diltiazem  (CARDIZEM  CD) 360 MG 24 hr capsule Take 1 capsule (360 mg total) by mouth daily. 12/18/22     ibuprofen  (ADVIL ,MOTRIN ) 200 MG tablet Take 400 mg by mouth every 6 (six) hours as needed for headache.    [provider]  insulin  degludec (TRESIBA  FLEXTOUCH) 100 UNIT/ML FlexTouch Pen Inject 30 Units into the skin daily. 12/03/22     insulin  isophane & regular human KwikPen (NOVOLIN  70/30 KWIKPEN) (70-30) 100 UNIT/ML KwikPen  Inject 20 Units into the skin in the morning and at bedtime. 11/13/22   Harris, Abigail, PA-C  Insulin  Pen Needle 32G X 4 MM MISC 1 Syringe by Does not apply route in the morning and at bedtime. 11/13/22   Harris, Abigail, PA-C  Multiple Vitamin (MULTIVITAMIN WITH MINERALS) TABS tablet Take 1 tablet by mouth daily.    [provider]    Family History Family History  Problem Relation Age of Onset   Hypertension Father    Diabetes Other     Social History Social History   Tobacco Use   Smoking status: Every Day    Current packs/day: 0.50    Average packs/day: 0.5 packs/day for 9.0 years (4.5 ttl pk-yrs)    Types: Cigarettes   Smokeless tobacco: Never  Vaping Use   Vaping status: Never Used  Substance Use Topics   Alcohol use: No   Drug use: No     Allergies   Patient has no known allergies.   Review of Systems Review of Systems Per HPI  Physical Exam Triage Vital Signs ED Triage Vitals  Encounter Vitals Group     BP 05/03/24 0839 (!) 168/105     Girls Systolic BP Percentile --      Girls Diastolic BP Percentile --      Boys Systolic BP Percentile --  Boys Diastolic BP Percentile --      Pulse Rate 05/03/24 0839 80     Resp 05/03/24 0839 18     Temp 05/03/24 0839 98 F (36.7 C)     Temp Source 05/03/24 0839 Oral     SpO2 05/03/24 0839 96 %     Weight --      Height --      Head Circumference --      Peak Flow --      Pain Score 05/03/24 0841 10     Pain Loc --      Pain Education --      Exclude from Growth Chart --    No data found.  Updated Vital Signs BP (!) 144/98 (BP Location: Right Arm)   Pulse 87   Temp 98 F (36.7 C) (Oral)   Resp 18   SpO2 97%   Visual Acuity Right Eye Distance:   Left Eye Distance:   Bilateral Distance:    Right Eye Near:   Left Eye Near:    Bilateral Near:     Physical Exam Vitals and nursing note reviewed.  Constitutional:      General: He is not in acute distress.    Appearance: Normal  appearance.  HENT:     Head: Normocephalic.  Eyes:     Extraocular Movements: Extraocular movements intact.     Conjunctiva/sclera: Conjunctivae normal.     Pupils: Pupils are equal, round, and reactive to light.  Cardiovascular:     Rate and Rhythm: Normal rate and regular rhythm.     Pulses: Normal pulses.     Heart sounds: Normal heart sounds.  Pulmonary:     Effort: Pulmonary effort is normal. No respiratory distress.     Breath sounds: Normal breath sounds. No stridor. No wheezing, rhonchi or rales.  Musculoskeletal:     Cervical back: Normal range of motion.  Skin:    General: Skin is warm and dry.      Neurological:     General: No focal deficit present.     Mental Status: He is alert and oriented to person, place, and time.  Psychiatric:        Mood and Affect: Mood normal.        Behavior: Behavior normal.      UC Treatments / Results  Labs (all labs ordered are listed, but only abnormal results are displayed) Labs Reviewed - No data to display  EKG   Radiology No results found.  Procedures Procedures (including critical care time)  Medications Ordered in UC Medications - No data to display  Initial Impression / Assessment and Plan / UC Course  I have reviewed the triage vital signs and the nursing notes.  Pertinent labs & imaging results that were available during my care of the patient were reviewed by me and considered in my medical decision making (see chart for details).  Symptom is consistent with folliculitis.  Will treat with doxycycline 100 mg twice daily for the next 5 days.  Supportive care recommendations were provided and discussed with the patient to include over-the-counter analgesics, warm compresses, and to keep the area clean and dry.  Recheck of BP prior to discharge, repeat BP 1 4/98.  Discussed indications with patient regarding follow-up.  Patient was in agreement with this plan of care and verbalizes understanding.  All questions were  answered.  Patient stable for discharge.  Work note was provided.  Final Clinical Impressions(s) / UC Diagnoses  Final diagnoses:  Folliculitis     Discharge Instructions      Take medication as prescribed. Increase fluids and allow for plenty of rest. You may take over-the-counter Tylenol  as needed for pain, fever, or general discomfort. Continue to apply warm compresses to the affected area 3-4 times daily while symptoms persist. Keep the area clean and dry. Do not pick or disrupt the area while symptoms persist. Seek care if you develop increased redness, swelling, or foul-smelling drainage from the site. Follow-up as needed.     ED Prescriptions     Medication Sig Dispense Auth. Provider   doxycycline (VIBRA-TABS) 100 MG tablet Take 1 tablet (100 mg total) by mouth 2 (two) times daily for 5 days. 10 tablet Leath-Warren, Etta PARAS, NP      PDMP not reviewed this encounter.   Gilmer Etta PARAS, NP 05/03/24 [phone]

## 2024-05-03 NOTE — Discharge Instructions (Addendum)
 Take medication as prescribed. Increase fluids and allow for plenty of rest. You may take over-the-counter Tylenol  as needed for pain, fever, or general discomfort. Continue to apply warm compresses to the affected area 3-4 times daily while symptoms persist. Keep the area clean and dry. Do not pick or disrupt the area while symptoms persist. Seek care if you develop increased redness, swelling, or foul-smelling drainage from the site. Follow-up as needed.

## 2024-05-03 NOTE — ED Triage Notes (Signed)
 2 bumps on scalp that appeared 4 days ago.  States areas were red with a white spot.  They popped and started to drain last night.  States areas are tender.
